# Patient Record
Sex: Male | Born: 1945
Health system: Southern US, Community
[De-identification: ages and names within clinical notes are randomized; demographics above are authoritative.]

## PROBLEM LIST (undated history)

## (undated) DIAGNOSIS — N400 Enlarged prostate without lower urinary tract symptoms: Secondary | ICD-10-CM

## (undated) DIAGNOSIS — I509 Heart failure, unspecified: Secondary | ICD-10-CM

## (undated) DIAGNOSIS — I493 Ventricular premature depolarization: Secondary | ICD-10-CM

## (undated) DIAGNOSIS — I2510 Atherosclerotic heart disease of native coronary artery without angina pectoris: Secondary | ICD-10-CM

## (undated) DIAGNOSIS — N281 Cyst of kidney, acquired: Secondary | ICD-10-CM

## (undated) DIAGNOSIS — F419 Anxiety disorder, unspecified: Secondary | ICD-10-CM

## (undated) DIAGNOSIS — E785 Hyperlipidemia, unspecified: Secondary | ICD-10-CM

## (undated) DIAGNOSIS — K5790 Diverticulosis of intestine, part unspecified, without perforation or abscess without bleeding: Secondary | ICD-10-CM

## (undated) HISTORY — DX: Anxiety disorder, unspecified: F41.9

## (undated) HISTORY — PX: HYDROCELE EXCISION / REPAIR: SUR1145

## (undated) HISTORY — PX: NEPHRECTOMY: SHX65

## (undated) HISTORY — DX: Diverticulosis of intestine, part unspecified, without perforation or abscess without bleeding: K57.90

## (undated) HISTORY — DX: Atherosclerotic heart disease of native coronary artery without angina pectoris: I25.10

## (undated) HISTORY — DX: Hyperlipidemia, unspecified: E78.5

## (undated) HISTORY — DX: Cyst of kidney, acquired: N28.1

## (undated) HISTORY — PX: TONSILLECTOMY: SUR1361

## (undated) HISTORY — DX: Ventricular premature depolarization: I49.3

## (undated) HISTORY — DX: Heart failure, unspecified: I50.9

## (undated) HISTORY — DX: Benign prostatic hyperplasia without lower urinary tract symptoms: N40.0

---

## 2003-06-26 ENCOUNTER — Ambulatory Visit (HOSPITAL_COMMUNITY): Admission: RE | Admit: 2003-06-26 | Discharge: 2003-06-26 | Payer: Self-pay | Admitting: Gastroenterology

## 2003-12-04 ENCOUNTER — Ambulatory Visit (HOSPITAL_COMMUNITY): Admission: RE | Admit: 2003-12-04 | Discharge: 2003-12-04 | Payer: Self-pay | Admitting: Cardiovascular Disease

## 2003-12-04 HISTORY — PX: CARDIAC CATHETERIZATION: SHX172

## 2004-05-06 ENCOUNTER — Ambulatory Visit: Payer: Self-pay | Admitting: Pulmonary Disease

## 2005-08-07 HISTORY — PX: US ECHOCARDIOGRAPHY: HXRAD669

## 2006-08-20 HISTORY — PX: CARDIOVASCULAR STRESS TEST: SHX262

## 2010-06-01 ENCOUNTER — Ambulatory Visit: Payer: Self-pay | Admitting: Cardiovascular Disease

## 2010-08-31 ENCOUNTER — Ambulatory Visit (HOSPITAL_COMMUNITY)
Admission: EM | Admit: 2010-08-31 | Discharge: 2010-09-01 | Disposition: A | Discharge status: Home / Self Care | Payer: BC Managed Care – PPO | Attending: Emergency Medicine | Admitting: Emergency Medicine

## 2010-08-31 DIAGNOSIS — K2990 Gastroduodenitis, unspecified, without bleeding: Secondary | ICD-10-CM | POA: Insufficient documentation

## 2010-08-31 DIAGNOSIS — K2970 Gastritis, unspecified, without bleeding: Secondary | ICD-10-CM | POA: Insufficient documentation

## 2010-08-31 DIAGNOSIS — R1310 Dysphagia, unspecified: Secondary | ICD-10-CM | POA: Insufficient documentation

## 2010-08-31 DIAGNOSIS — Z860100 Personal history of colon polyps, unspecified: Secondary | ICD-10-CM | POA: Insufficient documentation

## 2010-08-31 DIAGNOSIS — IMO0002 Reserved for concepts with insufficient information to code with codable children: Secondary | ICD-10-CM | POA: Insufficient documentation

## 2010-08-31 DIAGNOSIS — Z79899 Other long term (current) drug therapy: Secondary | ICD-10-CM | POA: Insufficient documentation

## 2010-08-31 DIAGNOSIS — K219 Gastro-esophageal reflux disease without esophagitis: Secondary | ICD-10-CM | POA: Insufficient documentation

## 2010-08-31 DIAGNOSIS — Z7902 Long term (current) use of antithrombotics/antiplatelets: Secondary | ICD-10-CM | POA: Insufficient documentation

## 2010-08-31 DIAGNOSIS — I2510 Atherosclerotic heart disease of native coronary artery without angina pectoris: Secondary | ICD-10-CM | POA: Insufficient documentation

## 2010-08-31 DIAGNOSIS — Z7982 Long term (current) use of aspirin: Secondary | ICD-10-CM | POA: Insufficient documentation

## 2010-08-31 DIAGNOSIS — I252 Old myocardial infarction: Secondary | ICD-10-CM | POA: Insufficient documentation

## 2010-08-31 DIAGNOSIS — I10 Essential (primary) hypertension: Secondary | ICD-10-CM | POA: Insufficient documentation

## 2010-08-31 DIAGNOSIS — T18108A Unspecified foreign body in esophagus causing other injury, initial encounter: Secondary | ICD-10-CM | POA: Insufficient documentation

## 2010-08-31 DIAGNOSIS — E7800 Pure hypercholesterolemia, unspecified: Secondary | ICD-10-CM | POA: Insufficient documentation

## 2010-10-05 NOTE — Op Note (Signed)
  NAMETRUE, GARCIAMARTINEZ              ACCOUNT NO.:  [id number]  MEDICAL RECORD NO.:  [id number]           PATIENT TYPE:  E  LOCATION:  WLED                         FACILITY:  [facility]  PHYSICIAN:  Graylin Shiver, M.D.   DATE OF BIRTH:  Jun 03, 1945  DATE OF PROCEDURE:  09/01/2010 DATE OF DISCHARGE:  09/01/2010                              OPERATIVE REPORT   PROCEDURE:  Upper gastrointestinal endoscopy with foreign body removal from esophagus.  INDICATIONS FOR PROCEDURE:  The patient is a 65 year old male who has been unable to swallow for several hours.  He keeps bringing up his saliva.  He had been eating steak for dinner.  I was called because of this problem.  Informed consent was obtained after explanation of the risks of bleeding, infection and perforation.  PREMEDICATIONS: 1. Fentanyl 50 mcg IV. 2. Versed 4 mg IV.  DESCRIPTION OF PROCEDURE:  With the patient in the left lateral decubitus position, the Pentax gastroscope was inserted into the oropharynx and passed into the esophagus.  In the proximal to mid esophageal region, a meat impaction was noted.  A 4-prong grasper was advanced down the scope and the meat impaction was grasped and it initially fragmented up somewhat.  I therefore tried to grasp this with a Lucina Mellow Net but was unable to.  I then put the 4-prong grasper back in and after a couple of times was able to grab the entire meat piece and remove it.  After this, the scope was then reinserted and I passed it down into the duodenum.  The second portion and bulb of the duodenum looked normal.  The stomach showed some gastritis in the antrum but the rest of the gastric mucosa looked okay.  No strictures were seen in the esophagus.  There was some irritation and friability in the upper to mid esophagus where the meat had been impacted.  He tolerated the procedure without complications.  IMPRESSION:  Meat impaction, removed.           ______________________________ Graylin Shiver, M.D.     SFG/MEDQ  D:  09/02/2010  T:  09/02/2010  Job:  [id number]  cc:   Gwen Pounds, MD Fax: [phone]  Everardo All. Madilyn Fireman, M.D. Fax: [phone]  Electronically Signed by Herbert Moors MD on 10/05/2010 10:03:42 AM

## 2010-11-29 ENCOUNTER — Encounter: Payer: Self-pay | Admitting: Cardiovascular Disease

## 2010-12-01 ENCOUNTER — Encounter: Payer: Self-pay | Admitting: Cardiovascular Disease

## 2010-12-01 ENCOUNTER — Ambulatory Visit (INDEPENDENT_AMBULATORY_CARE_PROVIDER_SITE_OTHER): Payer: BC Managed Care – PPO | Admitting: Cardiovascular Disease

## 2010-12-01 DIAGNOSIS — I2510 Atherosclerotic heart disease of native coronary artery without angina pectoris: Secondary | ICD-10-CM

## 2010-12-01 DIAGNOSIS — E785 Hyperlipidemia, unspecified: Secondary | ICD-10-CM

## 2010-12-01 DIAGNOSIS — I5020 Unspecified systolic (congestive) heart failure: Secondary | ICD-10-CM | POA: Insufficient documentation

## 2010-12-01 DIAGNOSIS — I509 Heart failure, unspecified: Secondary | ICD-10-CM | POA: Insufficient documentation

## 2010-12-01 NOTE — Assessment & Plan Note (Signed)
He's not having symptoms of congestive heart failure. He's on a very low dose of Diovan. His blood pressure is in the normal range I suspected we could increase his Diovan at 80 mg a day.  He sees Dr. Terrial Rhodes  his renal dysfunction. I'm not sure if he has tried a  higher dose of Diovan. His last creatinine I have is 1.5.  Since his renal function seems to be fairly stable, we will continue with his present dose of Diovan. I would be in favor of increasing his Diovan if Dr. Arrie Aran is in favor of it.

## 2010-12-01 NOTE — Progress Notes (Signed)
Maurice Barron Date of Birth  [birth year] [facility] [address]     [address] [place], [place]  [address] [phone]  Fax  [phone]  History of Present Illness:  Maurice Barron is a middle-aged gentleman with a history of coronary artery disease and congestive heart failure. Had a heart catheterization in 2005. During the heart catheterization he was found to have a ruptured plaque in his proximal left anterior descending artery. He had moderate irregularities and the other coronary arteries. He was started on Plavix at that time.  He's had a history of congestive heart failure. His initial ejection fraction was around 35%. With good medical therapy, his ejection fraction has now increased to 55%. He is no longer having any episodes of chest pain or shortness of breath.  He complains of having lots of bleeding and bruising on the Plavix. He would like to stop it. He's been exercising on an intermittent basis.  Current Outpatient Prescriptions on File Prior to Visit  Medication Sig Dispense Refill  . aspirin 81 MG tablet Take 81 mg by mouth daily.        . carvedilol (COREG) 25 MG tablet Take 25 mg by mouth 2 (two) times daily with a meal.        . clopidogrel (PLAVIX) 75 MG tablet Take 75 mg by mouth daily.        . fish oil-omega-3 fatty acids 1000 MG capsule Take 1 g by mouth daily.        . Multiple Vitamin (MULTIVITAMIN) tablet Take 1 tablet by mouth daily.        . rosuvastatin (CRESTOR) 40 MG tablet Take 40 mg by mouth daily.        . valsartan (DIOVAN) 40 MG tablet Take 40 mg by mouth daily.          Allergies  Allergen Reactions  . Penicillins     Past Medical History  Diagnosis Date  . CHF (congestive heart failure)     EF 35%. WITH MEDICATION HIS EF IS NOW 55%  . Hyperlipidemia   . PVC's (premature ventricular contractions)   . Coronary artery disease     ruptured plaque in the LAD - started Plavix at that point.  . Anxiety       Past Surgical History  Procedure Date  . Cardiac catheterization 12/04/2003    EF 30-35%  . Nephrectomy   . Tonsillectomy   . Hydrocele excision / repair   . US echocardiography 08/07/2005    EF 50-55%  . Cardiovascular stress test 08/20/2006    EF 50%    History  Smoking status  . Former Smoker  . Quit date: 05/08/1992  Smokeless tobacco  . Not on file    History  Alcohol Use No    Family History  Problem Relation Age of Onset  . Heart disease Mother   . Sarcoidosis Mother   . Heart attack Father   . Heart attack Brother   . Coronary artery disease Brother     Reviw of Systems:  Reviewed in the HPI.  All other systems are negative.  Physical Exam: BP 130/78  Pulse 72  Ht 5\' 8"  (1.727 m)  Wt 190 lb (86.183 kg)  BMI 28.89 kg/m2 The patient is alert and oriented x 3.  The mood and affect are normal.   Skin: warm and dry.  Color is normal.    HEENT:   the sclera are nonicteric.  The mucous membranes are moist.  The carotids are 2+ without bruits.  There is no thyromegaly.  There is no JVD.    Lungs: clear.  The chest wall is non tender.    Heart: regular rate with a normal S1 and S2.  There are no murmurs, gallops, or rubs. The PMI is not displaced.     Abdomen: good bowel sounds.  There is no guarding or rebound.  There is no hepatosplenomegaly or tenderness.  There are no masses.   Extremities:  no clubbing, cyanosis, or edema.  The legs are without rashes.  The distal pulses are intact.   Neuro:  Cranial nerves II - XII are intact.  Motor and sensory functions are intact.    The gait is normal.  Assessment / Plan:

## 2010-12-01 NOTE — Assessment & Plan Note (Signed)
He seems to be very stable from a coronary standpoint. He was put on Plavix at the time of his initial cardiac catheterization because of evidence of a ruptured plaque. He seems to be doing well. I encouraged him to continue with the medication since it seems to be helping him. I am pleased he's not had any episodes of angina.

## 2010-12-01 NOTE — Assessment & Plan Note (Signed)
We'll check the lipid profile at his next visit.

## 2013-07-01 ENCOUNTER — Telehealth: Payer: Self-pay | Admitting: Cardiovascular Disease

## 2013-07-01 NOTE — Telephone Encounter (Signed)
Received request from Nurse fax box, documents faxed for surgical clearance. °To: [facility]  °Fax number: [phone] °Attention °[id number]/kdm: °

## 2013-07-17 ENCOUNTER — Telehealth: Payer: Self-pay | Admitting: Cardiovascular Disease

## 2013-07-17 NOTE — Telephone Encounter (Signed)
New Prob    Pt is wanting to stop his Plavis. Please call.

## 2013-07-17 NOTE — Telephone Encounter (Signed)
Spoke with patient and he states he is doing ok. Patient wants to know if he needs to continue Plavix or if ok to stop. Will forward to [place] and Dr Acie Fredrickson for review

## 2013-07-18 NOTE — Telephone Encounter (Signed)
Would continue. Will address at next ov

## 2013-07-18 NOTE — Telephone Encounter (Signed)
App made/ pt will continue plavix.

## 2013-08-25 ENCOUNTER — Encounter: Payer: Self-pay | Admitting: Cardiovascular Disease

## 2013-09-02 ENCOUNTER — Encounter: Payer: Self-pay | Admitting: Cardiovascular Disease

## 2013-09-02 ENCOUNTER — Ambulatory Visit (INDEPENDENT_AMBULATORY_CARE_PROVIDER_SITE_OTHER): Payer: Commercial Managed Care - HMO | Admitting: Cardiovascular Disease

## 2013-09-02 VITALS — BP 142/79 | HR 59 | Ht 69.0 in | Wt 189.0 lb

## 2013-09-02 DIAGNOSIS — I509 Heart failure, unspecified: Secondary | ICD-10-CM

## 2013-09-02 DIAGNOSIS — E785 Hyperlipidemia, unspecified: Secondary | ICD-10-CM

## 2013-09-02 DIAGNOSIS — I2510 Atherosclerotic heart disease of native coronary artery without angina pectoris: Secondary | ICD-10-CM

## 2013-09-02 NOTE — Patient Instructions (Signed)
Your physician recommends that you continue on your current medications as directed. Please refer to the Current Medication list given to you today.  Your physician wants you to follow-up in: 1 year with Dr. Nahser.  You will receive a reminder letter in the mail two months in advance. If you don't receive a letter, please call our office to schedule the follow-up appointment.  

## 2013-09-02 NOTE — Assessment & Plan Note (Signed)
Maurice Barron is done very well. He's not having episodes of chest pain shortness breath. He does have some bleeding and bruising when he scratches himself this is probably because of the Plavix. He has a history of an ulcerated lesion in the LAD.  Discontinue the Plavix and aspirin and he's done quite well. He does have some bruising to the same medications.  His lipids are been monitored by his medical Dr.

## 2013-09-02 NOTE — Assessment & Plan Note (Signed)
Has history of CHF. He's on carvedilol and Avapro. We'll continue the same medications. I'll see him again in one year.

## 2013-09-02 NOTE — Progress Notes (Signed)
Maurice Barron Date of Birth  May 11, 1945 [facility] [address]     [place], [place]  [address] [phone]  Fax  [phone]   Problem list: 1. Coronary artery disease- mild irregularities including a plaque rupture in the LAD, treated medically 2. Chronic systolic congestive heart failure Repaired hyperlipidemia 4.  History of Present Illness:  Maurice Barron is a middle-aged gentleman with a history of coronary artery disease and congestive heart failure. Had a heart catheterization in 2005. During the heart catheterization he was found to have a ruptured plaque in his proximal left anterior descending artery. He had moderate irregularities and the other coronary arteries. He was started on Plavix at that time.  He's had a history of congestive heart failure. His initial ejection fraction was around 35%. With good medical therapy, his ejection fraction has now increased to 55%. He is no longer having any episodes of chest pain or shortness of breath.  He complains of having lots of bleeding and bruising on the Plavix. He would like to stop it. He's been exercising on an intermittent basis.  September 02, 2013:  Maurice Barron is doing well.   He was last seen 3 years ago.   He has been on Plavix for 10 years for ruptured plaque in its proximal LAD. [facility].     Current Outpatient Prescriptions on File Prior to Visit  Medication Sig Dispense Refill  . aspirin 81 MG tablet Take 81 mg by mouth daily.        . carvedilol (COREG) 25 MG tablet Take 25 mg by mouth 2 (two) times daily with a meal.        . clopidogrel (PLAVIX) 75 MG tablet Take 75 mg by mouth daily.        . fish oil-omega-3 fatty acids 1000 MG capsule Take 1 g by mouth daily.        . rosuvastatin (CRESTOR) 40 MG tablet Take 40 mg by mouth daily.         No current facility-administered medications on file prior to visit.    Allergies  Allergen  Reactions  . Penicillins     Past Medical History  Diagnosis Date  . CHF (congestive heart failure)     EF 35%. WITH MEDICATION HIS EF IS NOW 55%  . Hyperlipidemia   . PVC's (premature ventricular contractions)   . Coronary artery disease     ruptured plaque in the LAD - started Plavix at that point.  . Anxiety     Past Surgical History  Procedure Laterality Date  . Cardiac catheterization  12/04/2003    EF 30-35%  . Nephrectomy    . Tonsillectomy    . Hydrocele excision / repair    . US echocardiography  08/07/2005    EF 50-55%  . Cardiovascular stress test  08/20/2006    EF 50%    History  Smoking status  . Former Smoker  . Quit date: 05/08/1992  Smokeless tobacco  . Not on file    History  Alcohol Use No    Family History  Problem Relation Age of Onset  . Heart disease Mother   . Sarcoidosis Mother   . Heart attack Father   . Heart attack Brother   . Coronary artery disease Brother     Reviw of Systems:  Reviewed in the HPI.  All other systems are negative.  Physical  Exam: BP 142/79  Pulse 59  Ht 5\' 9"  (1.753 m)  Wt 189 lb (85.73 kg)  BMI 27.90 kg/m2 The patient is alert and oriented x 3.  The mood and affect are normal.   Skin: warm and dry.  Color is normal.    HEENT:   the sclera are nonicteric.  The mucous membranes are moist.  The carotids are 2+ without bruits.  There is no thyromegaly.  There is no JVD.    Lungs: clear.  The chest wall is non tender.    Heart: regular rate with a normal S1 and S2.  There are no murmurs, gallops, or rubs. The PMI is not displaced.     Abdomen: good bowel sounds.  There is no guarding or rebound.  There is no hepatosplenomegaly or tenderness.  There are no masses.   Extremities:  no clubbing, cyanosis, or edema.  The legs are without rashes.  The distal pulses are intact.   Neuro:  Cranial nerves II - XII are intact.  Motor and sensory functions are intact.    The gait is normal.  ECG: Sinus brady at 59.  No ST or T wave changes.  Assessment / Plan:

## 2014-06-17 DIAGNOSIS — I129 Hypertensive chronic kidney disease with stage 1 through stage 4 chronic kidney disease, or unspecified chronic kidney disease: Secondary | ICD-10-CM | POA: Diagnosis not present

## 2014-06-17 DIAGNOSIS — R809 Proteinuria, unspecified: Secondary | ICD-10-CM | POA: Diagnosis not present

## 2014-06-17 DIAGNOSIS — N182 Chronic kidney disease, stage 2 (mild): Secondary | ICD-10-CM | POA: Diagnosis not present

## 2014-06-17 DIAGNOSIS — N2581 Secondary hyperparathyroidism of renal origin: Secondary | ICD-10-CM | POA: Diagnosis not present

## 2014-09-02 ENCOUNTER — Ambulatory Visit (INDEPENDENT_AMBULATORY_CARE_PROVIDER_SITE_OTHER): Payer: Commercial Managed Care - HMO | Admitting: Cardiovascular Disease

## 2014-09-02 ENCOUNTER — Encounter: Payer: Self-pay | Admitting: Cardiovascular Disease

## 2014-09-02 VITALS — BP 118/60 | HR 67 | Ht 69.0 in | Wt 195.1 lb

## 2014-09-02 DIAGNOSIS — I5022 Chronic systolic (congestive) heart failure: Secondary | ICD-10-CM

## 2014-09-02 NOTE — Progress Notes (Signed)
Cardiology Office Note   Date:  09/02/2014   ID:  Maurice Barron, DOB 1945/07/28, MRN [id number]  PCP:  Tivis Ringer, MD  Cardiologist:   Thayer Headings, MD   Chief Complaint  Patient presents with  . Follow-up   1. Coronary artery disease- mild irregularities including a plaque rupture in the LAD, treated medically 2. Chronic systolic congestive heart failure 3.  Hyperlipidemia   History of Present Illness:  Maurice Barron is a middle-aged gentleman with a history of coronary artery disease and congestive heart failure. Had a heart catheterization in 2005. During the heart catheterization he was found to have a ruptured plaque in his proximal left anterior descending artery. He had moderate irregularities and the other coronary arteries. He was started on Plavix at that time.  He's had a history of congestive heart failure. His initial ejection fraction was around 35%. With good medical therapy, his ejection fraction has now increased to 55%. He is no longer having any episodes of chest pain or shortness of breath.  He complains of having lots of bleeding and bruising on the Plavix. He would like to stop it. He's been exercising on an intermittent basis.  September 02, 2013:  Maurice Barron is doing well. He was last seen 3 years ago. He has been on Plavix for 10 years for ruptured plaque in its proximal LAD. [facility].    September 02, 2014:  Maurice Barron is a 69 y.o. male who presents for  Follow up of his CHF.  Still working out regularly.  Goes to the [facility]    Past Medical History  Diagnosis Date  . CHF (congestive heart failure)     EF 35%. WITH MEDICATION HIS EF IS NOW 55%  . Hyperlipidemia   . PVC's (premature ventricular contractions)   . Coronary artery disease     ruptured plaque in the LAD - started Plavix at that point.  . Anxiety     Past Surgical History  Procedure Laterality Date  . Cardiac catheterization  12/04/2003   EF 30-35%  . Nephrectomy    . Tonsillectomy    . Hydrocele excision / repair    . US echocardiography  08/07/2005    EF 50-55%  . Cardiovascular stress test  08/20/2006    EF 50%     Current Outpatient Prescriptions  Medication Sig Dispense Refill  . aspirin 81 MG tablet Take 81 mg by mouth daily.      . carvedilol (COREG) 25 MG tablet Take 25 mg by mouth 2 (two) times daily with a meal.      . clopidogrel (PLAVIX) 75 MG tablet Take 75 mg by mouth daily.      . fish oil-omega-3 fatty acids 1000 MG capsule Take 1 g by mouth daily.      . rosuvastatin (CRESTOR) 40 MG tablet Take 40 mg by mouth daily.       No current facility-administered medications for this visit.    Allergies:   Penicillins    Social History:  The patient  reports that he quit smoking about 22 years ago. He does not have any smokeless tobacco history on file. He reports that he does not drink alcohol or use illicit drugs.   Family History:  The patient's family history includes Coronary artery disease in his brother; Heart attack in his brother and father; Heart disease in his mother; Sarcoidosis in his mother.  ROS:  Please see the history of present illness.    Review of Systems: Constitutional:  denies fever, chills, diaphoresis, appetite change and fatigue.  HEENT: denies photophobia, eye pain, redness, hearing loss, ear pain, congestion, sore throat, rhinorrhea, sneezing, neck pain, neck stiffness and tinnitus.  Respiratory: denies SOB, DOE, cough, chest tightness, and wheezing.  Cardiovascular: denies chest pain, palpitations and leg swelling.  Gastrointestinal: denies nausea, vomiting, abdominal pain, diarrhea, constipation, blood in stool.  Genitourinary: denies dysuria, urgency, frequency, hematuria, flank pain and difficulty urinating.  Musculoskeletal: denies  myalgias, back pain, joint swelling, arthralgias and gait problem.   Skin: denies pallor, rash and wound.  Neurological: denies dizziness,  seizures, syncope, weakness, light-headedness, numbness and headaches.   Hematological: denies adenopathy, easy bruising, personal or family bleeding history.  Psychiatric/ Behavioral: denies suicidal ideation, mood changes, confusion, nervousness, sleep disturbance and agitation.       All other systems are reviewed and negative.    PHYSICAL EXAM: VS:  BP 118/60 mmHg  Pulse 67  Ht 5\' 9"  (1.753 m)  Wt 195 lb 1.9 oz (88.506 kg)  BMI 28.80 kg/m2 , BMI Body mass index is 28.8 kg/(m^2). GEN: Well nourished, well developed, in no acute distress HEENT: normal Neck: no JVD, carotid bruits, or masses Cardiac: RRR; no murmurs, rubs, or gallops,no edema  Respiratory:  clear to auscultation bilaterally, normal work of breathing GI: soft, nontender, nondistended, + BS MS: no deformity or atrophy Skin: warm and dry, no rash Neuro:  Strength and sensation are intact Psych: normal   EKG:  EKG is ordered today. The ekg ordered today demonstrates NSR at 67.    Recent Labs: No results found for requested labs within last 365 days.    Lipid Panel No results found for: CHOL, TRIG, HDL, CHOLHDL, VLDL, LDLCALC, LDLDIRECT    Wt Readings from Last 3 Encounters:  09/02/14 195 lb 1.9 oz (88.506 kg)  09/02/13 189 lb (85.73 kg)  12/01/10 190 lb (86.183 kg)      Other studies Reviewed: Additional studies/ records that were reviewed today include: . Review of the above records demonstrates:    ASSESSMENT AND PLAN:  1. Coronary artery disease- mild irregularities including a plaque rupture in the LAD, treated medically  2. Chronic systolic congestive heart failure- continue Coreg 25 BId, Avapro 75 a day , will consider Echo next year  3.   hyperlipidemia   Current medicines are reviewed at length with the patient today.  The patient does not have concerns regarding medicines.  The following changes have been made:  no change  Labs/ tests ordered today include: No orders of the  defined types were placed in this encounter.     Disposition:   FU with me in 1 year     Signed, Nahser, Wonda Cheng, MD  09/02/2014 2:16 PM    [facility] [address], [place], [place]  [address] Phone: [phone]; Fax: [phone]

## 2014-09-02 NOTE — Patient Instructions (Signed)
Medication Instructions:  Your physician recommends that you continue on your current medications as directed. Please refer to the Current Medication list given to you today.   Labwork: None  Testing/Procedures: None  Follow-Up: Your physician wants you to follow-up in: 1 year with Dr. Nahser.  You will receive a reminder letter in the mail two months in advance. If you don't receive a letter, please call our office to schedule the follow-up appointment.      

## 2014-10-15 DIAGNOSIS — I2510 Atherosclerotic heart disease of native coronary artery without angina pectoris: Secondary | ICD-10-CM | POA: Diagnosis not present

## 2014-10-15 DIAGNOSIS — E785 Hyperlipidemia, unspecified: Secondary | ICD-10-CM | POA: Diagnosis not present

## 2014-10-15 DIAGNOSIS — G4733 Obstructive sleep apnea (adult) (pediatric): Secondary | ICD-10-CM | POA: Diagnosis not present

## 2014-10-15 DIAGNOSIS — I10 Essential (primary) hypertension: Secondary | ICD-10-CM | POA: Diagnosis not present

## 2014-10-15 DIAGNOSIS — N183 Chronic kidney disease, stage 3 (moderate): Secondary | ICD-10-CM | POA: Diagnosis not present

## 2014-10-27 ENCOUNTER — Encounter: Payer: Self-pay | Admitting: Cardiovascular Disease

## 2015-01-23 DIAGNOSIS — Z23 Encounter for immunization: Secondary | ICD-10-CM | POA: Diagnosis not present

## 2015-03-30 DIAGNOSIS — H52223 Regular astigmatism, bilateral: Secondary | ICD-10-CM | POA: Diagnosis not present

## 2015-03-30 DIAGNOSIS — H5210 Myopia, unspecified eye: Secondary | ICD-10-CM | POA: Diagnosis not present

## 2015-05-21 DIAGNOSIS — Z125 Encounter for screening for malignant neoplasm of prostate: Secondary | ICD-10-CM | POA: Diagnosis not present

## 2015-05-21 DIAGNOSIS — E784 Other hyperlipidemia: Secondary | ICD-10-CM | POA: Diagnosis not present

## 2015-05-21 DIAGNOSIS — I2510 Atherosclerotic heart disease of native coronary artery without angina pectoris: Secondary | ICD-10-CM | POA: Diagnosis not present

## 2015-05-21 DIAGNOSIS — N182 Chronic kidney disease, stage 2 (mild): Secondary | ICD-10-CM | POA: Diagnosis not present

## 2015-05-27 DIAGNOSIS — Z1212 Encounter for screening for malignant neoplasm of rectum: Secondary | ICD-10-CM | POA: Diagnosis not present

## 2015-05-28 DIAGNOSIS — N183 Chronic kidney disease, stage 3 (moderate): Secondary | ICD-10-CM | POA: Diagnosis not present

## 2015-05-28 DIAGNOSIS — Z0000 Encounter for general adult medical examination without abnormal findings: Secondary | ICD-10-CM | POA: Diagnosis not present

## 2015-05-28 DIAGNOSIS — M1990 Unspecified osteoarthritis, unspecified site: Secondary | ICD-10-CM | POA: Diagnosis not present

## 2015-05-28 DIAGNOSIS — E784 Other hyperlipidemia: Secondary | ICD-10-CM | POA: Diagnosis not present

## 2015-05-28 DIAGNOSIS — I509 Heart failure, unspecified: Secondary | ICD-10-CM | POA: Diagnosis not present

## 2015-05-28 DIAGNOSIS — I2510 Atherosclerotic heart disease of native coronary artery without angina pectoris: Secondary | ICD-10-CM | POA: Diagnosis not present

## 2015-05-28 DIAGNOSIS — D692 Other nonthrombocytopenic purpura: Secondary | ICD-10-CM | POA: Diagnosis not present

## 2015-05-28 DIAGNOSIS — I10 Essential (primary) hypertension: Secondary | ICD-10-CM | POA: Diagnosis not present

## 2015-05-28 DIAGNOSIS — R4702 Dysphasia: Secondary | ICD-10-CM | POA: Diagnosis not present

## 2015-09-07 ENCOUNTER — Ambulatory Visit (INDEPENDENT_AMBULATORY_CARE_PROVIDER_SITE_OTHER): Payer: Commercial Managed Care - HMO | Admitting: Cardiovascular Disease

## 2015-09-07 ENCOUNTER — Encounter: Payer: Self-pay | Admitting: Cardiovascular Disease

## 2015-09-07 VITALS — BP 130/70 | HR 58 | Ht 69.0 in | Wt 195.4 lb

## 2015-09-07 DIAGNOSIS — E785 Hyperlipidemia, unspecified: Secondary | ICD-10-CM

## 2015-09-07 DIAGNOSIS — I25119 Atherosclerotic heart disease of native coronary artery with unspecified angina pectoris: Secondary | ICD-10-CM | POA: Diagnosis not present

## 2015-09-07 DIAGNOSIS — I5022 Chronic systolic (congestive) heart failure: Secondary | ICD-10-CM

## 2015-09-07 NOTE — Progress Notes (Signed)
Cardiology Office Note   Date:  09/07/2015   ID:  Maurice Barron, DOB 1945-07-05, MRN [id number]  PCP:  Tivis Ringer, MD  Cardiologist:   Thayer Headings, MD   Chief Complaint  Patient presents with  . Follow-up  . Coronary Artery Disease   1. Coronary artery disease- mild irregularities including a plaque rupture in the LAD, treated medically 2. Chronic systolic congestive heart failure 3.  Hyperlipidemia   History of Present Illness:  Maurice Barron is a middle-aged gentleman with a history of coronary artery disease and congestive heart failure. Had a heart catheterization in 2005. During the heart catheterization he was found to have a ruptured plaque in his proximal left anterior descending artery. He had moderate irregularities and the other coronary arteries. He was started on Plavix at that time.  He's had a history of congestive heart failure. His initial ejection fraction was around 35%. With good medical therapy, his ejection fraction has now increased to 55%. He is no longer having any episodes of chest pain or shortness of breath.  He complains of having lots of bleeding and bruising on the Plavix. He would like to stop it. He's been exercising on an intermittent basis.  September 02, 2013:  Maurice Barron is doing well. He was last seen 3 years ago. He has been on Plavix for 10 years for ruptured plaque in its proximal LAD. [facility].    September 02, 2014:  Maurice Barron is a 70 y.o. male who presents for  Follow up of his CHF.  Still working out regularly.  Goes to the [facility].  Sep 07, 2015:  Maurice Barron is seen today for follow up of his chronic systolic CHF , mild CAD and hyperlipidemia Still goes to the [facility] 3 days a week.   His EF has now been normal for the past several years. ( although he has not had an echo in years.   Past Medical History  Diagnosis Date  . CHF (congestive heart failure) (HCC)     EF 35%. WITH MEDICATION HIS  EF IS NOW 55%  . Hyperlipidemia   . PVC's (premature ventricular contractions)   . Coronary artery disease     ruptured plaque in the LAD - started Plavix at that point.  . Anxiety     Past Surgical History  Procedure Laterality Date  . Cardiac catheterization  12/04/2003    EF 30-35%  . Nephrectomy    . Tonsillectomy    . Hydrocele excision / repair    . US echocardiography  08/07/2005    EF 50-55%  . Cardiovascular stress test  08/20/2006    EF 50%     Current Outpatient Prescriptions  Medication Sig Dispense Refill  . aspirin 81 MG tablet Take 81 mg by mouth daily.      . carvedilol (COREG) 25 MG tablet Take 25 mg by mouth 2 (two) times daily with a meal.      . clopidogrel (PLAVIX) 75 MG tablet Take 75 mg by mouth daily.      . fish oil-omega-3 fatty acids 1000 MG capsule Take 1 g by mouth daily.      . irbesartan (AVAPRO) 75 MG tablet Take 75 mg by mouth daily.    . rosuvastatin (CRESTOR) 40 MG tablet Take 40 mg by mouth daily.       No current facility-administered medications for this visit.    Allergies:  Penicillins    Social History:  The patient  reports that he quit smoking about 23 years ago. He does not have any smokeless tobacco history on file. He reports that he does not drink alcohol or use illicit drugs.   Family History:  The patient's family history includes Coronary artery disease in his brother; Heart attack in his brother and father; Heart disease in his mother; Sarcoidosis in his mother.    ROS:  Please see the history of present illness.    Review of Systems: Constitutional:  denies fever, chills, diaphoresis, appetite change and fatigue.  HEENT: denies photophobia, eye pain, redness, hearing loss, ear pain, congestion, sore throat, rhinorrhea, sneezing, neck pain, neck stiffness and tinnitus.  Respiratory: denies SOB, DOE, cough, chest tightness, and wheezing.  Cardiovascular: denies chest pain, palpitations and leg swelling.    Gastrointestinal: denies nausea, vomiting, abdominal pain, diarrhea, constipation, blood in stool.  Genitourinary: denies dysuria, urgency, frequency, hematuria, flank pain and difficulty urinating.  Musculoskeletal: denies  myalgias, back pain, joint swelling, arthralgias and gait problem.   Skin: denies pallor, rash and wound.  Neurological: denies dizziness, seizures, syncope, weakness, light-headedness, numbness and headaches.   Hematological: denies adenopathy, easy bruising, personal or family bleeding history.  Psychiatric/ Behavioral: denies suicidal ideation, mood changes, confusion, nervousness, sleep disturbance and agitation.       All other systems are reviewed and negative.    PHYSICAL EXAM: VS:  BP 130/70 mmHg  Pulse 58  Ht 5\' 9"  (1.753 m)  Wt 195 lb 6.4 oz (88.633 kg)  BMI 28.84 kg/m2  SpO2 95% , BMI Body mass index is 28.84 kg/(m^2). GEN: Well nourished, well developed, in no acute distress HEENT: normal Neck: no JVD, carotid bruits, or masses Cardiac: RRR; no murmurs, rubs, or gallops,no edema  Respiratory:  clear to auscultation bilaterally, normal work of breathing GI: soft, nontender, nondistended, + BS MS: no deformity or atrophy Skin: warm and dry, no rash Neuro:  Strength and sensation are intact Psych: normal   EKG:  EKG is ordered today. The ekg ordered today demonstrates sinus brady at 58.   Otherwise normal ECG .    Recent Labs: No results found for requested labs within last 365 days.    Lipid Panel No results found for: CHOL, TRIG, HDL, CHOLHDL, VLDL, LDLCALC, LDLDIRECT    Wt Readings from Last 3 Encounters:  09/07/15 195 lb 6.4 oz (88.633 kg)  09/02/14 195 lb 1.9 oz (88.506 kg)  09/02/13 189 lb (85.73 kg)      Other studies Reviewed: Additional studies/ records that were reviewed today include: . Review of the above records demonstrates:    ASSESSMENT AND PLAN:  1. Coronary artery disease- mild irregularities including a  plaque rupture in the LAD, treated medically  2. Chronic systolic congestive heart failure- continue Coreg 25 BId, Avapro 75 a day ,   3.   Hyperlipidemia - managed by Dr. Dagmar Hait.    Current medicines are reviewed at length with the patient today.  The patient does not have concerns regarding medicines.  The following changes have been made:  no change  Labs/ tests ordered today include: No orders of the defined types were placed in this encounter.     Disposition:   FU with me in 1 year     Signed, Kysean Sweet, Wonda Cheng, MD  09/07/2015 2:09 PM    [address], [place], [place]  [address] Phone: [phone]; Fax: [phone]

## 2015-09-07 NOTE — Patient Instructions (Signed)
Medication Instructions:  Your physician recommends that you continue on your current medications as directed. Please refer to the Current Medication list given to you today.   Labwork: None Ordered   Testing/Procedures: None Ordered   Follow-Up: Your physician wants you to follow-up in: 1 year with Dr. Nahser.  You will receive a reminder letter in the mail two months in advance. If you don't receive a letter, please call our office to schedule the follow-up appointment.   If you need a refill on your cardiac medications before your next appointment, please call your pharmacy.   Thank you for choosing [facility]! Lashena Signer, RN [phone]    

## 2015-09-28 DIAGNOSIS — N182 Chronic kidney disease, stage 2 (mild): Secondary | ICD-10-CM | POA: Diagnosis not present

## 2015-09-28 DIAGNOSIS — N2581 Secondary hyperparathyroidism of renal origin: Secondary | ICD-10-CM | POA: Diagnosis not present

## 2015-09-28 DIAGNOSIS — R809 Proteinuria, unspecified: Secondary | ICD-10-CM | POA: Diagnosis not present

## 2015-09-28 DIAGNOSIS — I129 Hypertensive chronic kidney disease with stage 1 through stage 4 chronic kidney disease, or unspecified chronic kidney disease: Secondary | ICD-10-CM | POA: Diagnosis not present

## 2015-09-28 DIAGNOSIS — E785 Hyperlipidemia, unspecified: Secondary | ICD-10-CM | POA: Diagnosis not present

## 2015-10-14 ENCOUNTER — Encounter: Payer: Self-pay | Admitting: Cardiovascular Disease

## 2015-10-14 DIAGNOSIS — N183 Chronic kidney disease, stage 3 (moderate): Secondary | ICD-10-CM | POA: Diagnosis not present

## 2015-12-07 DIAGNOSIS — I2510 Atherosclerotic heart disease of native coronary artery without angina pectoris: Secondary | ICD-10-CM | POA: Diagnosis not present

## 2015-12-07 DIAGNOSIS — R4702 Dysphasia: Secondary | ICD-10-CM | POA: Diagnosis not present

## 2015-12-07 DIAGNOSIS — I10 Essential (primary) hypertension: Secondary | ICD-10-CM | POA: Diagnosis not present

## 2015-12-07 DIAGNOSIS — M79642 Pain in left hand: Secondary | ICD-10-CM | POA: Diagnosis not present

## 2015-12-07 DIAGNOSIS — G4733 Obstructive sleep apnea (adult) (pediatric): Secondary | ICD-10-CM | POA: Diagnosis not present

## 2015-12-07 DIAGNOSIS — I509 Heart failure, unspecified: Secondary | ICD-10-CM | POA: Diagnosis not present

## 2015-12-07 DIAGNOSIS — N183 Chronic kidney disease, stage 3 (moderate): Secondary | ICD-10-CM | POA: Diagnosis not present

## 2016-01-18 DIAGNOSIS — Z23 Encounter for immunization: Secondary | ICD-10-CM | POA: Diagnosis not present

## 2016-01-21 DIAGNOSIS — G4733 Obstructive sleep apnea (adult) (pediatric): Secondary | ICD-10-CM | POA: Diagnosis not present

## 2016-02-10 DIAGNOSIS — G4733 Obstructive sleep apnea (adult) (pediatric): Secondary | ICD-10-CM | POA: Diagnosis not present

## 2016-02-20 DIAGNOSIS — G4733 Obstructive sleep apnea (adult) (pediatric): Secondary | ICD-10-CM | POA: Diagnosis not present

## 2016-03-22 DIAGNOSIS — G4733 Obstructive sleep apnea (adult) (pediatric): Secondary | ICD-10-CM | POA: Diagnosis not present

## 2016-04-18 DIAGNOSIS — I10 Essential (primary) hypertension: Secondary | ICD-10-CM | POA: Diagnosis not present

## 2016-04-18 DIAGNOSIS — Z0100 Encounter for examination of eyes and vision without abnormal findings: Secondary | ICD-10-CM | POA: Diagnosis not present

## 2016-04-18 DIAGNOSIS — H524 Presbyopia: Secondary | ICD-10-CM | POA: Diagnosis not present

## 2016-04-21 DIAGNOSIS — G4733 Obstructive sleep apnea (adult) (pediatric): Secondary | ICD-10-CM | POA: Diagnosis not present

## 2016-05-22 DIAGNOSIS — G4733 Obstructive sleep apnea (adult) (pediatric): Secondary | ICD-10-CM | POA: Diagnosis not present

## 2016-05-30 DIAGNOSIS — I10 Essential (primary) hypertension: Secondary | ICD-10-CM | POA: Diagnosis not present

## 2016-05-30 DIAGNOSIS — Z125 Encounter for screening for malignant neoplasm of prostate: Secondary | ICD-10-CM | POA: Diagnosis not present

## 2016-05-30 DIAGNOSIS — E784 Other hyperlipidemia: Secondary | ICD-10-CM | POA: Diagnosis not present

## 2016-05-30 DIAGNOSIS — R8299 Other abnormal findings in urine: Secondary | ICD-10-CM | POA: Diagnosis not present

## 2016-06-06 DIAGNOSIS — I509 Heart failure, unspecified: Secondary | ICD-10-CM | POA: Diagnosis not present

## 2016-06-06 DIAGNOSIS — D692 Other nonthrombocytopenic purpura: Secondary | ICD-10-CM | POA: Diagnosis not present

## 2016-06-06 DIAGNOSIS — G4733 Obstructive sleep apnea (adult) (pediatric): Secondary | ICD-10-CM | POA: Diagnosis not present

## 2016-06-06 DIAGNOSIS — L309 Dermatitis, unspecified: Secondary | ICD-10-CM | POA: Diagnosis not present

## 2016-06-06 DIAGNOSIS — R4702 Dysphasia: Secondary | ICD-10-CM | POA: Diagnosis not present

## 2016-06-06 DIAGNOSIS — I2510 Atherosclerotic heart disease of native coronary artery without angina pectoris: Secondary | ICD-10-CM | POA: Diagnosis not present

## 2016-06-06 DIAGNOSIS — E784 Other hyperlipidemia: Secondary | ICD-10-CM | POA: Diagnosis not present

## 2016-06-06 DIAGNOSIS — Z0000 Encounter for general adult medical examination without abnormal findings: Secondary | ICD-10-CM | POA: Diagnosis not present

## 2016-06-06 DIAGNOSIS — Z6829 Body mass index (BMI) 29.0-29.9, adult: Secondary | ICD-10-CM | POA: Diagnosis not present

## 2016-06-07 DIAGNOSIS — G4733 Obstructive sleep apnea (adult) (pediatric): Secondary | ICD-10-CM | POA: Diagnosis not present

## 2016-06-21 DIAGNOSIS — Z1212 Encounter for screening for malignant neoplasm of rectum: Secondary | ICD-10-CM | POA: Diagnosis not present

## 2016-06-22 DIAGNOSIS — G4733 Obstructive sleep apnea (adult) (pediatric): Secondary | ICD-10-CM | POA: Diagnosis not present

## 2016-07-20 DIAGNOSIS — G4733 Obstructive sleep apnea (adult) (pediatric): Secondary | ICD-10-CM | POA: Diagnosis not present

## 2016-08-08 DIAGNOSIS — I10 Essential (primary) hypertension: Secondary | ICD-10-CM | POA: Diagnosis not present

## 2016-08-20 DIAGNOSIS — G4733 Obstructive sleep apnea (adult) (pediatric): Secondary | ICD-10-CM | POA: Diagnosis not present

## 2016-09-19 DIAGNOSIS — G4733 Obstructive sleep apnea (adult) (pediatric): Secondary | ICD-10-CM | POA: Diagnosis not present

## 2016-09-27 DIAGNOSIS — G4733 Obstructive sleep apnea (adult) (pediatric): Secondary | ICD-10-CM | POA: Diagnosis not present

## 2016-10-20 DIAGNOSIS — G4733 Obstructive sleep apnea (adult) (pediatric): Secondary | ICD-10-CM | POA: Diagnosis not present

## 2016-11-19 DIAGNOSIS — G4733 Obstructive sleep apnea (adult) (pediatric): Secondary | ICD-10-CM | POA: Diagnosis not present

## 2016-12-07 DIAGNOSIS — M1990 Unspecified osteoarthritis, unspecified site: Secondary | ICD-10-CM | POA: Diagnosis not present

## 2016-12-07 DIAGNOSIS — N183 Chronic kidney disease, stage 3 (moderate): Secondary | ICD-10-CM | POA: Diagnosis not present

## 2016-12-07 DIAGNOSIS — I10 Essential (primary) hypertension: Secondary | ICD-10-CM | POA: Diagnosis not present

## 2016-12-07 DIAGNOSIS — R4702 Dysphasia: Secondary | ICD-10-CM | POA: Diagnosis not present

## 2016-12-07 DIAGNOSIS — I2510 Atherosclerotic heart disease of native coronary artery without angina pectoris: Secondary | ICD-10-CM | POA: Diagnosis not present

## 2016-12-07 DIAGNOSIS — Z6829 Body mass index (BMI) 29.0-29.9, adult: Secondary | ICD-10-CM | POA: Diagnosis not present

## 2016-12-07 DIAGNOSIS — G4733 Obstructive sleep apnea (adult) (pediatric): Secondary | ICD-10-CM | POA: Diagnosis not present

## 2016-12-19 DIAGNOSIS — N183 Chronic kidney disease, stage 3 (moderate): Secondary | ICD-10-CM | POA: Diagnosis not present

## 2017-01-09 DIAGNOSIS — R809 Proteinuria, unspecified: Secondary | ICD-10-CM | POA: Diagnosis not present

## 2017-01-09 DIAGNOSIS — I129 Hypertensive chronic kidney disease with stage 1 through stage 4 chronic kidney disease, or unspecified chronic kidney disease: Secondary | ICD-10-CM | POA: Diagnosis not present

## 2017-01-09 DIAGNOSIS — N2581 Secondary hyperparathyroidism of renal origin: Secondary | ICD-10-CM | POA: Diagnosis not present

## 2017-01-09 DIAGNOSIS — E785 Hyperlipidemia, unspecified: Secondary | ICD-10-CM | POA: Diagnosis not present

## 2017-01-09 DIAGNOSIS — N182 Chronic kidney disease, stage 2 (mild): Secondary | ICD-10-CM | POA: Diagnosis not present

## 2017-01-20 DIAGNOSIS — Z23 Encounter for immunization: Secondary | ICD-10-CM | POA: Diagnosis not present

## 2017-06-12 DIAGNOSIS — I10 Essential (primary) hypertension: Secondary | ICD-10-CM | POA: Diagnosis not present

## 2017-06-12 DIAGNOSIS — R82998 Other abnormal findings in urine: Secondary | ICD-10-CM | POA: Diagnosis not present

## 2017-06-12 DIAGNOSIS — Z125 Encounter for screening for malignant neoplasm of prostate: Secondary | ICD-10-CM | POA: Diagnosis not present

## 2017-06-12 DIAGNOSIS — E7849 Other hyperlipidemia: Secondary | ICD-10-CM | POA: Diagnosis not present

## 2017-06-19 DIAGNOSIS — I10 Essential (primary) hypertension: Secondary | ICD-10-CM | POA: Diagnosis not present

## 2017-06-19 DIAGNOSIS — E7849 Other hyperlipidemia: Secondary | ICD-10-CM | POA: Diagnosis not present

## 2017-06-19 DIAGNOSIS — I509 Heart failure, unspecified: Secondary | ICD-10-CM | POA: Diagnosis not present

## 2017-06-19 DIAGNOSIS — I2510 Atherosclerotic heart disease of native coronary artery without angina pectoris: Secondary | ICD-10-CM | POA: Diagnosis not present

## 2017-06-19 DIAGNOSIS — N183 Chronic kidney disease, stage 3 (moderate): Secondary | ICD-10-CM | POA: Diagnosis not present

## 2017-06-19 DIAGNOSIS — G4733 Obstructive sleep apnea (adult) (pediatric): Secondary | ICD-10-CM | POA: Diagnosis not present

## 2017-06-19 DIAGNOSIS — N2581 Secondary hyperparathyroidism of renal origin: Secondary | ICD-10-CM | POA: Diagnosis not present

## 2017-06-19 DIAGNOSIS — Z0000 Encounter for general adult medical examination without abnormal findings: Secondary | ICD-10-CM | POA: Diagnosis not present

## 2017-06-19 DIAGNOSIS — R4702 Dysphasia: Secondary | ICD-10-CM | POA: Diagnosis not present

## 2017-06-21 DIAGNOSIS — Z1212 Encounter for screening for malignant neoplasm of rectum: Secondary | ICD-10-CM | POA: Diagnosis not present

## 2017-12-18 ENCOUNTER — Telehealth: Payer: Self-pay

## 2017-12-18 DIAGNOSIS — R4702 Dysphasia: Secondary | ICD-10-CM | POA: Diagnosis not present

## 2017-12-18 DIAGNOSIS — N183 Chronic kidney disease, stage 3 (moderate): Secondary | ICD-10-CM | POA: Diagnosis not present

## 2017-12-18 DIAGNOSIS — Z6829 Body mass index (BMI) 29.0-29.9, adult: Secondary | ICD-10-CM | POA: Diagnosis not present

## 2017-12-18 DIAGNOSIS — I10 Essential (primary) hypertension: Secondary | ICD-10-CM | POA: Diagnosis not present

## 2017-12-18 DIAGNOSIS — E7849 Other hyperlipidemia: Secondary | ICD-10-CM | POA: Diagnosis not present

## 2017-12-18 DIAGNOSIS — I2510 Atherosclerotic heart disease of native coronary artery without angina pectoris: Secondary | ICD-10-CM | POA: Diagnosis not present

## 2017-12-18 NOTE — Telephone Encounter (Signed)
Referral sent to scheduling. Notes on file.

## 2017-12-18 NOTE — Telephone Encounter (Signed)
Notes on file.  

## 2018-01-19 DIAGNOSIS — Z23 Encounter for immunization: Secondary | ICD-10-CM | POA: Diagnosis not present

## 2018-02-14 DIAGNOSIS — N2581 Secondary hyperparathyroidism of renal origin: Secondary | ICD-10-CM | POA: Diagnosis not present

## 2018-02-14 DIAGNOSIS — R809 Proteinuria, unspecified: Secondary | ICD-10-CM | POA: Diagnosis not present

## 2018-02-14 DIAGNOSIS — I129 Hypertensive chronic kidney disease with stage 1 through stage 4 chronic kidney disease, or unspecified chronic kidney disease: Secondary | ICD-10-CM | POA: Diagnosis not present

## 2018-02-14 DIAGNOSIS — E785 Hyperlipidemia, unspecified: Secondary | ICD-10-CM | POA: Diagnosis not present

## 2018-02-14 DIAGNOSIS — N182 Chronic kidney disease, stage 2 (mild): Secondary | ICD-10-CM | POA: Diagnosis not present

## 2018-05-23 DIAGNOSIS — G4733 Obstructive sleep apnea (adult) (pediatric): Secondary | ICD-10-CM | POA: Diagnosis not present

## 2018-06-15 DIAGNOSIS — J069 Acute upper respiratory infection, unspecified: Secondary | ICD-10-CM | POA: Diagnosis not present

## 2018-07-09 DIAGNOSIS — R82998 Other abnormal findings in urine: Secondary | ICD-10-CM | POA: Diagnosis not present

## 2018-07-09 DIAGNOSIS — Z125 Encounter for screening for malignant neoplasm of prostate: Secondary | ICD-10-CM | POA: Diagnosis not present

## 2018-07-09 DIAGNOSIS — E7849 Other hyperlipidemia: Secondary | ICD-10-CM | POA: Diagnosis not present

## 2018-07-09 DIAGNOSIS — I10 Essential (primary) hypertension: Secondary | ICD-10-CM | POA: Diagnosis not present

## 2018-07-16 DIAGNOSIS — Z1212 Encounter for screening for malignant neoplasm of rectum: Secondary | ICD-10-CM | POA: Diagnosis not present

## 2018-07-16 DIAGNOSIS — G4733 Obstructive sleep apnea (adult) (pediatric): Secondary | ICD-10-CM | POA: Diagnosis not present

## 2018-07-16 DIAGNOSIS — R05 Cough: Secondary | ICD-10-CM | POA: Diagnosis not present

## 2018-07-16 DIAGNOSIS — N183 Chronic kidney disease, stage 3 (moderate): Secondary | ICD-10-CM | POA: Diagnosis not present

## 2018-07-16 DIAGNOSIS — R4702 Dysphasia: Secondary | ICD-10-CM | POA: Diagnosis not present

## 2018-07-16 DIAGNOSIS — E7849 Other hyperlipidemia: Secondary | ICD-10-CM | POA: Diagnosis not present

## 2018-07-16 DIAGNOSIS — Z0000 Encounter for general adult medical examination without abnormal findings: Secondary | ICD-10-CM | POA: Diagnosis not present

## 2018-07-16 DIAGNOSIS — I2510 Atherosclerotic heart disease of native coronary artery without angina pectoris: Secondary | ICD-10-CM | POA: Diagnosis not present

## 2018-07-16 DIAGNOSIS — I10 Essential (primary) hypertension: Secondary | ICD-10-CM | POA: Diagnosis not present

## 2018-07-16 DIAGNOSIS — I509 Heart failure, unspecified: Secondary | ICD-10-CM | POA: Diagnosis not present

## 2018-07-17 ENCOUNTER — Emergency Department (HOSPITAL_BASED_OUTPATIENT_CLINIC_OR_DEPARTMENT_OTHER)
Admission: EM | Admit: 2018-07-17 | Discharge: 2018-07-17 | Disposition: A | Discharge status: Home / Self Care | Payer: Medicare HMO | Attending: Emergency Medicine | Admitting: Emergency Medicine

## 2018-07-17 ENCOUNTER — Emergency Department (HOSPITAL_BASED_OUTPATIENT_CLINIC_OR_DEPARTMENT_OTHER): Payer: Medicare HMO

## 2018-07-17 ENCOUNTER — Encounter (HOSPITAL_BASED_OUTPATIENT_CLINIC_OR_DEPARTMENT_OTHER): Payer: Self-pay

## 2018-07-17 ENCOUNTER — Other Ambulatory Visit: Payer: Self-pay

## 2018-07-17 DIAGNOSIS — R1084 Generalized abdominal pain: Secondary | ICD-10-CM | POA: Diagnosis not present

## 2018-07-17 DIAGNOSIS — Z5321 Procedure and treatment not carried out due to patient leaving prior to being seen by health care provider: Secondary | ICD-10-CM | POA: Insufficient documentation

## 2018-07-17 DIAGNOSIS — K5730 Diverticulosis of large intestine without perforation or abscess without bleeding: Secondary | ICD-10-CM | POA: Diagnosis not present

## 2018-07-17 DIAGNOSIS — R110 Nausea: Secondary | ICD-10-CM | POA: Diagnosis not present

## 2018-07-17 LAB — URINALYSIS, ROUTINE W REFLEX MICROSCOPIC
Bilirubin Urine: NEGATIVE
Glucose, UA: NEGATIVE mg/dL
Ketones, ur: NEGATIVE mg/dL
Leukocytes,Ua: NEGATIVE
Nitrite: NEGATIVE
Protein, ur: 100 mg/dL — AB
Specific Gravity, Urine: 1.025 (ref 1.005–1.030)
pH: 6 (ref 5.0–8.0)

## 2018-07-17 LAB — CBC
HCT: 44.5 % (ref 39.0–52.0)
Hemoglobin: 14 g/dL (ref 13.0–17.0)
MCH: 28.1 pg (ref 26.0–34.0)
MCHC: 31.5 g/dL (ref 30.0–36.0)
MCV: 89.4 fL (ref 80.0–100.0)
Platelets: 211 10*3/uL (ref 150–400)
RBC: 4.98 MIL/uL (ref 4.22–5.81)
RDW: 13.7 % (ref 11.5–15.5)
WBC: 9.8 10*3/uL (ref 4.0–10.5)
nRBC: 0 % (ref 0.0–0.2)

## 2018-07-17 LAB — COMPREHENSIVE METABOLIC PANEL
ALT: 20 U/L (ref 0–44)
AST: 22 U/L (ref 15–41)
Albumin: 4.4 g/dL (ref 3.5–5.0)
Alkaline Phosphatase: 67 U/L (ref 38–126)
Anion gap: 8 (ref 5–15)
BUN: 19 mg/dL (ref 8–23)
CO2: 27 mmol/L (ref 22–32)
Calcium: 9.6 mg/dL (ref 8.9–10.3)
Chloride: 99 mmol/L (ref 98–111)
Creatinine, Ser: 1.41 mg/dL — ABNORMAL HIGH (ref 0.61–1.24)
GFR calc Af Amer: 56 mL/min — ABNORMAL LOW (ref >60)
GFR calc non Af Amer: 49 mL/min — ABNORMAL LOW (ref >60)
Glucose, Bld: 108 mg/dL — ABNORMAL HIGH (ref 70–99)
Potassium: 4.2 mmol/L (ref 3.5–5.1)
Sodium: 134 mmol/L — ABNORMAL LOW (ref 135–145)
Total Bilirubin: 0.8 mg/dL (ref 0.3–1.2)
Total Protein: 7.7 g/dL (ref 6.5–8.1)

## 2018-07-17 LAB — URINALYSIS, MICROSCOPIC (REFLEX)

## 2018-07-17 LAB — LIPASE, BLOOD: Lipase: 43 U/L (ref 11–51)

## 2018-07-17 MED ORDER — SODIUM CHLORIDE 0.9 % IV BOLUS
500.0000 mL | Freq: Once | INTRAVENOUS | Status: AC
  Administered 2018-07-17: 500 mL via INTRAVENOUS

## 2018-07-17 MED ORDER — MORPHINE SULFATE (PF) 4 MG/ML IV SOLN
4.0000 mg | Freq: Once | INTRAVENOUS | Status: AC
  Administered 2018-07-17: 4 mg via INTRAVENOUS
  Filled 2018-07-17: qty 1

## 2018-07-17 MED ORDER — DICYCLOMINE HCL 20 MG PO TABS: 20.0000 mg | ORAL_TABLET | Freq: Two times a day (BID) | ORAL | 0 refills | Status: DC

## 2018-07-17 MED ORDER — SODIUM CHLORIDE 0.9% FLUSH
3.0000 mL | Freq: Once | INTRAVENOUS | Status: DC
  Filled 2018-07-17: qty 3

## 2018-07-17 NOTE — ED Notes (Signed)
Concerned about wait time.

## 2018-07-17 NOTE — Discharge Instructions (Signed)
Your work-up has been overall reassuring today.  Your blood work has been reassuring.  Creatinine mildly elevated at 1.4.  Have this rechecked.  Have discussed your CAT scan findings with you.  Cyst on your right kidney that can need follow-up.  No signs of any infection or obstruction in your belly.  We can take MiraLAX to help with constipation.  Have given you Bentyl for any abdominal cramping.  Continue the Protonix at home for the bloating feeling.  You will need GI follow-up for possible endoscopy for ongoing symptoms.  Make sure to follow with primary care doctor as well.  Return to the ED if you develop any chest pain, shortness of breath, worsening abdominal pain or for the reason.

## 2018-07-17 NOTE — ED Triage Notes (Signed)
C/o generalized abd pain, nausea x 2 days "feel like I need to have a bowel movement" with last normal BM this am-he did have issues with constipation 2 days prior to pain-states he was seen by PCP yesterday for annual check up-he mentioned the pain-pt had CXR-pt NAD-steady gait

## 2018-07-18 ENCOUNTER — Encounter: Payer: Self-pay | Admitting: *Deleted

## 2018-07-18 DIAGNOSIS — Z6827 Body mass index (BMI) 27.0-27.9, adult: Secondary | ICD-10-CM | POA: Diagnosis not present

## 2018-07-18 DIAGNOSIS — R4702 Dysphasia: Secondary | ICD-10-CM | POA: Diagnosis not present

## 2018-07-18 DIAGNOSIS — I10 Essential (primary) hypertension: Secondary | ICD-10-CM | POA: Diagnosis not present

## 2018-07-18 DIAGNOSIS — I2510 Atherosclerotic heart disease of native coronary artery without angina pectoris: Secondary | ICD-10-CM | POA: Diagnosis not present

## 2018-07-18 DIAGNOSIS — R109 Unspecified abdominal pain: Secondary | ICD-10-CM | POA: Diagnosis not present

## 2018-07-19 ENCOUNTER — Emergency Department (HOSPITAL_COMMUNITY): Payer: Medicare HMO

## 2018-07-19 ENCOUNTER — Encounter (HOSPITAL_COMMUNITY): Payer: Self-pay | Admitting: Emergency Medicine

## 2018-07-19 ENCOUNTER — Inpatient Hospital Stay (HOSPITAL_COMMUNITY)
Admission: EM | Admit: 2018-07-19 | Discharge: 2018-07-23 | DRG: 683 | Disposition: A | Discharge status: Home / Self Care | Payer: Medicare HMO | Source: Ambulatory Visit | Attending: Internal Medicine | Admitting: Internal Medicine

## 2018-07-19 ENCOUNTER — Ambulatory Visit: Payer: Medicare HMO | Admitting: Internal Medicine

## 2018-07-19 ENCOUNTER — Other Ambulatory Visit: Payer: Self-pay

## 2018-07-19 ENCOUNTER — Ambulatory Visit: Payer: Medicare HMO | Admitting: Gastroenterology

## 2018-07-19 DIAGNOSIS — K222 Esophageal obstruction: Secondary | ICD-10-CM | POA: Diagnosis present

## 2018-07-19 DIAGNOSIS — K449 Diaphragmatic hernia without obstruction or gangrene: Secondary | ICD-10-CM | POA: Diagnosis present

## 2018-07-19 DIAGNOSIS — Z905 Acquired absence of kidney: Secondary | ICD-10-CM | POA: Diagnosis not present

## 2018-07-19 DIAGNOSIS — I110 Hypertensive heart disease with heart failure: Secondary | ICD-10-CM | POA: Diagnosis present

## 2018-07-19 DIAGNOSIS — R109 Unspecified abdominal pain: Secondary | ICD-10-CM | POA: Diagnosis not present

## 2018-07-19 DIAGNOSIS — E860 Dehydration: Secondary | ICD-10-CM | POA: Diagnosis present

## 2018-07-19 DIAGNOSIS — R918 Other nonspecific abnormal finding of lung field: Secondary | ICD-10-CM | POA: Diagnosis not present

## 2018-07-19 DIAGNOSIS — R52 Pain, unspecified: Secondary | ICD-10-CM

## 2018-07-19 DIAGNOSIS — R55 Syncope and collapse: Secondary | ICD-10-CM | POA: Diagnosis present

## 2018-07-19 DIAGNOSIS — Z87891 Personal history of nicotine dependence: Secondary | ICD-10-CM

## 2018-07-19 DIAGNOSIS — R7989 Other specified abnormal findings of blood chemistry: Secondary | ICD-10-CM | POA: Diagnosis not present

## 2018-07-19 DIAGNOSIS — I4891 Unspecified atrial fibrillation: Secondary | ICD-10-CM | POA: Diagnosis present

## 2018-07-19 DIAGNOSIS — K2970 Gastritis, unspecified, without bleeding: Secondary | ICD-10-CM | POA: Diagnosis present

## 2018-07-19 DIAGNOSIS — K5790 Diverticulosis of intestine, part unspecified, without perforation or abscess without bleeding: Secondary | ICD-10-CM | POA: Diagnosis present

## 2018-07-19 DIAGNOSIS — I2510 Atherosclerotic heart disease of native coronary artery without angina pectoris: Secondary | ICD-10-CM | POA: Diagnosis present

## 2018-07-19 DIAGNOSIS — R0902 Hypoxemia: Secondary | ICD-10-CM

## 2018-07-19 DIAGNOSIS — Z880 Allergy status to penicillin: Secondary | ICD-10-CM | POA: Diagnosis not present

## 2018-07-19 DIAGNOSIS — R4702 Dysphasia: Secondary | ICD-10-CM | POA: Diagnosis present

## 2018-07-19 DIAGNOSIS — S0990XA Unspecified injury of head, initial encounter: Secondary | ICD-10-CM | POA: Diagnosis not present

## 2018-07-19 DIAGNOSIS — R110 Nausea: Secondary | ICD-10-CM | POA: Diagnosis not present

## 2018-07-19 DIAGNOSIS — N400 Enlarged prostate without lower urinary tract symptoms: Secondary | ICD-10-CM

## 2018-07-19 DIAGNOSIS — N179 Acute kidney failure, unspecified: Secondary | ICD-10-CM | POA: Diagnosis present

## 2018-07-19 DIAGNOSIS — E785 Hyperlipidemia, unspecified: Secondary | ICD-10-CM | POA: Diagnosis present

## 2018-07-19 DIAGNOSIS — R140 Abdominal distension (gaseous): Secondary | ICD-10-CM

## 2018-07-19 DIAGNOSIS — I252 Old myocardial infarction: Secondary | ICD-10-CM

## 2018-07-19 DIAGNOSIS — R630 Anorexia: Secondary | ICD-10-CM | POA: Diagnosis present

## 2018-07-19 DIAGNOSIS — M5136 Other intervertebral disc degeneration, lumbar region: Secondary | ICD-10-CM | POA: Diagnosis not present

## 2018-07-19 DIAGNOSIS — I5022 Chronic systolic (congestive) heart failure: Secondary | ICD-10-CM | POA: Diagnosis present

## 2018-07-19 DIAGNOSIS — I951 Orthostatic hypotension: Secondary | ICD-10-CM | POA: Diagnosis present

## 2018-07-19 DIAGNOSIS — R296 Repeated falls: Secondary | ICD-10-CM | POA: Diagnosis present

## 2018-07-19 DIAGNOSIS — R778 Other specified abnormalities of plasma proteins: Secondary | ICD-10-CM

## 2018-07-19 DIAGNOSIS — M47816 Spondylosis without myelopathy or radiculopathy, lumbar region: Secondary | ICD-10-CM | POA: Diagnosis not present

## 2018-07-19 DIAGNOSIS — I959 Hypotension, unspecified: Secondary | ICD-10-CM | POA: Diagnosis not present

## 2018-07-19 DIAGNOSIS — R5381 Other malaise: Secondary | ICD-10-CM | POA: Diagnosis present

## 2018-07-19 DIAGNOSIS — E871 Hypo-osmolality and hyponatremia: Secondary | ICD-10-CM | POA: Diagnosis present

## 2018-07-19 DIAGNOSIS — I480 Paroxysmal atrial fibrillation: Secondary | ICD-10-CM | POA: Diagnosis not present

## 2018-07-19 DIAGNOSIS — K22719 Barrett's esophagus with dysplasia, unspecified: Secondary | ICD-10-CM | POA: Diagnosis not present

## 2018-07-19 DIAGNOSIS — R42 Dizziness and giddiness: Secondary | ICD-10-CM | POA: Diagnosis not present

## 2018-07-19 DIAGNOSIS — K5900 Constipation, unspecified: Secondary | ICD-10-CM | POA: Diagnosis not present

## 2018-07-19 DIAGNOSIS — E875 Hyperkalemia: Secondary | ICD-10-CM | POA: Diagnosis present

## 2018-07-19 DIAGNOSIS — Z8249 Family history of ischemic heart disease and other diseases of the circulatory system: Secondary | ICD-10-CM

## 2018-07-19 DIAGNOSIS — Z79899 Other long term (current) drug therapy: Secondary | ICD-10-CM

## 2018-07-19 DIAGNOSIS — Z7982 Long term (current) use of aspirin: Secondary | ICD-10-CM

## 2018-07-19 DIAGNOSIS — Z7902 Long term (current) use of antithrombotics/antiplatelets: Secondary | ICD-10-CM

## 2018-07-19 DIAGNOSIS — K2980 Duodenitis without bleeding: Secondary | ICD-10-CM | POA: Diagnosis not present

## 2018-07-19 DIAGNOSIS — I351 Nonrheumatic aortic (valve) insufficiency: Secondary | ICD-10-CM | POA: Diagnosis not present

## 2018-07-19 DIAGNOSIS — R531 Weakness: Secondary | ICD-10-CM | POA: Diagnosis present

## 2018-07-19 DIAGNOSIS — I5020 Unspecified systolic (congestive) heart failure: Secondary | ICD-10-CM | POA: Diagnosis present

## 2018-07-19 DIAGNOSIS — S199XXA Unspecified injury of neck, initial encounter: Secondary | ICD-10-CM | POA: Diagnosis not present

## 2018-07-19 LAB — CBC WITH DIFFERENTIAL/PLATELET
Abs Immature Granulocytes: 0.03 K/uL (ref 0.00–0.07)
Basophils Absolute: 0.1 K/uL (ref 0.0–0.1)
Basophils Relative: 1 %
Eosinophils Absolute: 0.1 K/uL (ref 0.0–0.5)
Eosinophils Relative: 1 %
HCT: 42.7 % (ref 39.0–52.0)
Hemoglobin: 13.3 g/dL (ref 13.0–17.0)
Immature Granulocytes: 0 %
Lymphocytes Relative: 13 %
Lymphs Abs: 1.2 K/uL (ref 0.7–4.0)
MCH: 27.3 pg (ref 26.0–34.0)
MCHC: 31.1 g/dL (ref 30.0–36.0)
MCV: 87.5 fL (ref 80.0–100.0)
Monocytes Absolute: 0.8 K/uL (ref 0.1–1.0)
Monocytes Relative: 9 %
Neutro Abs: 6.6 K/uL (ref 1.7–7.7)
Neutrophils Relative %: 76 %
Platelets: 187 K/uL (ref 150–400)
RBC: 4.88 MIL/uL (ref 4.22–5.81)
RDW: 13.8 % (ref 11.5–15.5)
WBC: 8.8 K/uL (ref 4.0–10.5)
nRBC: 0 % (ref 0.0–0.2)

## 2018-07-19 LAB — URINALYSIS, ROUTINE W REFLEX MICROSCOPIC
Bacteria, UA: NONE SEEN
Bilirubin Urine: NEGATIVE
Glucose, UA: NEGATIVE mg/dL
Ketones, ur: NEGATIVE mg/dL
Leukocytes,Ua: NEGATIVE
Nitrite: NEGATIVE
Protein, ur: 30 mg/dL — AB
Specific Gravity, Urine: 1.006 (ref 1.005–1.030)
pH: 6 (ref 5.0–8.0)

## 2018-07-19 LAB — BASIC METABOLIC PANEL
Anion gap: 14 (ref 5–15)
Anion gap: 9 (ref 5–15)
BUN: 27 mg/dL — ABNORMAL HIGH (ref 8–23)
BUN: 25 mg/dL — ABNORMAL HIGH (ref 8–23)
CO2: 20 mmol/L — ABNORMAL LOW (ref 22–32)
CO2: 24 mmol/L (ref 22–32)
Calcium: 8.9 mg/dL (ref 8.9–10.3)
Calcium: 8.8 mg/dL — ABNORMAL LOW (ref 8.9–10.3)
Chloride: 97 mmol/L — ABNORMAL LOW (ref 98–111)
Chloride: 102 mmol/L (ref 98–111)
Creatinine, Ser: 2.13 mg/dL — ABNORMAL HIGH (ref 0.61–1.24)
Creatinine, Ser: 2.17 mg/dL — ABNORMAL HIGH (ref 0.61–1.24)
GFR calc Af Amer: 34 mL/min — ABNORMAL LOW (ref >60)
GFR calc Af Amer: 34 mL/min — ABNORMAL LOW (ref >60)
GFR calc non Af Amer: 30 mL/min — ABNORMAL LOW (ref >60)
GFR calc non Af Amer: 29 mL/min — ABNORMAL LOW (ref >60)
Glucose, Bld: 99 mg/dL (ref 70–99)
Glucose, Bld: 89 mg/dL (ref 70–99)
Potassium: 6.1 mmol/L — ABNORMAL HIGH (ref 3.5–5.1)
Potassium: 4 mmol/L (ref 3.5–5.1)
Sodium: 131 mmol/L — ABNORMAL LOW (ref 135–145)
Sodium: 135 mmol/L (ref 135–145)

## 2018-07-19 LAB — TROPONIN I: Troponin I: 0.05 ng/mL (ref <0.03)

## 2018-07-19 MED ORDER — ONDANSETRON HCL 4 MG PO TABS
4.0000 mg | ORAL_TABLET | Freq: Four times a day (QID) | ORAL | Status: DC | PRN
  Administered 2018-07-20 – 2018-07-21 (×3): 4 mg via ORAL
  Filled 2018-07-19 (×3): qty 1

## 2018-07-19 MED ORDER — INSULIN ASPART 100 UNIT/ML ~~LOC~~ SOLN
5.0000 [IU] | Freq: Once | SUBCUTANEOUS | Status: AC
  Administered 2018-07-19: 5 [IU] via INTRAVENOUS

## 2018-07-19 MED ORDER — ACETAMINOPHEN 325 MG PO TABS: 650.0000 mg | ORAL_TABLET | Freq: Four times a day (QID) | ORAL | Status: DC | PRN

## 2018-07-19 MED ORDER — SODIUM CHLORIDE 0.9% FLUSH
3.0000 mL | Freq: Two times a day (BID) | INTRAVENOUS | Status: DC
  Administered 2018-07-19 – 2018-07-20 (×2): 3 mL via INTRAVENOUS

## 2018-07-19 MED ORDER — HEPARIN SODIUM (PORCINE) 5000 UNIT/ML IJ SOLN
5000.0000 [IU] | Freq: Three times a day (TID) | INTRAMUSCULAR | Status: DC
  Administered 2018-07-19 – 2018-07-22 (×8): 5000 [IU] via SUBCUTANEOUS
  Filled 2018-07-19 (×8): qty 1

## 2018-07-19 MED ORDER — DICYCLOMINE HCL 10 MG PO CAPS: 10.0000 mg | ORAL_CAPSULE | Freq: Three times a day (TID) | ORAL | Status: DC | PRN

## 2018-07-19 MED ORDER — TRAMADOL HCL 50 MG PO TABS
50.0000 mg | ORAL_TABLET | Freq: Four times a day (QID) | ORAL | Status: DC | PRN
  Administered 2018-07-20: 50 mg via ORAL
  Filled 2018-07-19: qty 1

## 2018-07-19 MED ORDER — SODIUM CHLORIDE 0.9 % IV BOLUS
1000.0000 mL | Freq: Once | INTRAVENOUS | Status: AC
  Administered 2018-07-19: 1000 mL via INTRAVENOUS

## 2018-07-19 MED ORDER — POLYETHYLENE GLYCOL 3350 17 G PO PACK: 17.0000 g | PACK | Freq: Every day | ORAL | Status: DC | PRN

## 2018-07-19 MED ORDER — SODIUM CHLORIDE 0.9 % IV SOLN
INTRAVENOUS | Status: DC
  Administered 2018-07-19 – 2018-07-20 (×2): via INTRAVENOUS

## 2018-07-19 MED ORDER — DEXTROSE 50 % IV SOLN
1.0000 | Freq: Once | INTRAVENOUS | Status: AC
  Administered 2018-07-19: 50 mL via INTRAVENOUS
  Filled 2018-07-19: qty 50

## 2018-07-19 MED ORDER — ACETAMINOPHEN 650 MG RE SUPP: 650.0000 mg | Freq: Four times a day (QID) | RECTAL | Status: DC | PRN

## 2018-07-19 MED ORDER — ONDANSETRON HCL 4 MG/2ML IJ SOLN: 4.0000 mg | Freq: Four times a day (QID) | INTRAMUSCULAR | Status: DC | PRN

## 2018-07-19 MED ORDER — SODIUM ZIRCONIUM CYCLOSILICATE 5 G PO PACK
5.0000 g | PACK | Freq: Once | ORAL | Status: AC
  Administered 2018-07-19: 5 g via ORAL
  Filled 2018-07-19: qty 1

## 2018-07-19 NOTE — ED Provider Notes (Signed)
[facility] EMERGENCY DEPARTMENT Provider Note   CSN: [id number] Arrival date & time: 07/19/18  1323    History   Chief Complaint Chief Complaint  Patient presents with   Hypotension   Weakness    HPI Maurice Barron is a 73 y.o. male.     Pt presents to the ED with wife and daughter via EMS after hypotension noted in PCP's office. Pt reports he was having diffuse abdominal pain 4-5 days ago and seen at [facility] for same; had workup done with no acute findings and was discharged home with Rx Bentyl. Pt went to see his PCP again yesterday due to continued pain and was prescribed 50 mg Tramadol and 5 mg Flexeril which he took last night around 8 PM. Reports his pain improved but when he woke up this morning he felt lightheaded and dizzy. Wife reports pt was stumbling around. They went back to PCP's office after calling him and letting him be aware of symptoms; while in the office pt was hypotensive and sent to the ED for further evaluation. Pt was given 300 CCs NS bolus prior to coming to the ED; BP on arrival 106/54. Pt's only complaint currently besides lightheadedness is constipation; last BM 2 days ago. Denies any pain currently, nausea, vomiting, chest pain, shortness of breath, vision changes, unilateral weakness or numbness.    Weakness  Associated symptoms: dizziness   Associated symptoms: no abdominal pain, no chest pain, no diarrhea, no fever, no headaches, no nausea, no shortness of breath and no vomiting     Past Medical History:  Diagnosis Date   Anxiety    CHF (congestive heart failure) (HCC)    EF 35%. WITH MEDICATION HIS EF IS NOW 55%   Coronary artery disease    ruptured plaque in the LAD - started Plavix at that point.   Diverticulosis    Enlarged prostate    Hyperlipidemia    PVC's (premature ventricular contractions)    Renal cyst     Patient Active Problem List   Diagnosis Date Noted   Coronary artery disease 12/01/2010    Congestive heart failure ([place]) 12/01/2010   Hyperlipidemia 12/01/2010    Past Surgical History:  Procedure Laterality Date   CARDIAC CATHETERIZATION  12/04/2003   EF 30-35%   CARDIOVASCULAR STRESS TEST  08/20/2006   EF 50%   HYDROCELE EXCISION / REPAIR     NEPHRECTOMY     TONSILLECTOMY     US ECHOCARDIOGRAPHY  08/07/2005   EF 50-55%        Home Medications    Prior to Admission medications   Medication Sig Start Date End Date Taking? Authorizing Provider  aspirin 81 MG tablet Take 81 mg by mouth daily.      [provider]  carvedilol (COREG) 25 MG tablet Take 25 mg by mouth 2 (two) times daily with a meal.      [provider]  clopidogrel (PLAVIX) 75 MG tablet Take 75 mg by mouth daily.      [provider]  dicyclomine (BENTYL) 20 MG tablet Take 1 tablet (20 mg total) by mouth 2 (two) times daily. 07/17/18   Doristine Devoid, PA-C  fish oil-omega-3 fatty acids 1000 MG capsule Take 1 g by mouth daily.      [provider]  irbesartan (AVAPRO) 75 MG tablet Take 75 mg by mouth daily.    [provider]  rosuvastatin (CRESTOR) 40 MG tablet Take 40 mg by mouth  daily.      [provider]    Family History Family History  Problem Relation Age of Onset   Heart disease Mother    Sarcoidosis Mother    Heart attack Father    Heart attack Brother    Coronary artery disease Brother     Social History Social History   Tobacco Use   Smoking status: Former Smoker    Last attempt to quit: 05/08/1992    Years since quitting: 26.2   Smokeless tobacco: Never Used  Substance Use Topics   Alcohol use: Yes    Comment: occ   Drug use: No     Allergies   Penicillins   Review of Systems Review of Systems  Constitutional: Negative for chills and fever.  Eyes: Negative for visual disturbance.  Respiratory: Negative for shortness of breath.   Cardiovascular: Negative for chest pain.  Gastrointestinal:  Positive for constipation. Negative for abdominal pain, diarrhea, nausea and vomiting.  Musculoskeletal: Negative for back pain and neck pain.  Skin: Positive for wound.  Neurological: Positive for dizziness, weakness (generalized) and light-headedness. Negative for speech difficulty, numbness and headaches.  Hematological: Bruises/bleeds easily.  Psychiatric/Behavioral: Negative for confusion.     Physical Exam Updated Vital Signs BP (!) 101/53    Pulse 70    Temp 97.7 F (36.5 C) (Oral)    Resp 17    Ht 5\' 9"  (1.753 m)    Wt 81.2 kg    SpO2 95%    BMI 26.43 kg/m   Physical Exam Vitals signs and nursing note reviewed.  Constitutional:      Appearance: He is not ill-appearing or diaphoretic.  HENT:     Head: Normocephalic.     Comments: Small 2 cm linear abrasion to upper lip Eyes:     Conjunctiva/sclera: Conjunctivae normal.     Pupils: Pupils are equal, round, and reactive to light.  Neck:     Musculoskeletal: Normal range of motion and neck supple. No neck rigidity or muscular tenderness.  Cardiovascular:     Rate and Rhythm: Normal rate and regular rhythm.  Pulmonary:     Effort: Pulmonary effort is normal.     Breath sounds: Normal breath sounds.  Abdominal:     Palpations: Abdomen is soft.     Tenderness: There is no abdominal tenderness.  Musculoskeletal: Normal range of motion.        General: No swelling or tenderness.  Skin:    General: Skin is warm and dry.  Neurological:     General: No focal deficit present.     Mental Status: He is alert.     Sensory: No sensory deficit.     Motor: No weakness.      ED Treatments / Results  Labs (all labs ordered are listed, but only abnormal results are displayed) Labs Reviewed  BASIC METABOLIC PANEL - Abnormal; Notable for the following components:      Result Value   Sodium 131 (*)    Potassium 6.1 (*)    Chloride 97 (*)    CO2 20 (*)    BUN 27 (*)    Creatinine, Ser 2.13 (*)    GFR calc non Af Amer 30 (*)     GFR calc Af Amer 34 (*)    All other components within normal limits  TROPONIN I - Abnormal; Notable for the following components:   Troponin I 0.05 (*)    All other components within normal limits  CBC WITH  DIFFERENTIAL/PLATELET  URINALYSIS, ROUTINE W REFLEX MICROSCOPIC  H. PYLORI ANTIBODY, IGG    EKG EKG Interpretation  Date/Time:  Friday July 19 2018 13:25:19 EDT Ventricular Rate:  69 PR Interval:    QRS Duration: 105 QT Interval:  417 QTC Calculation: 447 R Axis:   7 Text Interpretation:  Sinus rhythm Nonspecific T wave abnormality No previous tracing Confirmed by Lajean Saver [phone]) on 07/19/2018 1:28:24 PM Also confirmed by Gareth Morgan [phone])  on 07/19/2018 3:51:30 PM   Radiology Ct Abdomen Pelvis Wo Contrast  Result Date: 07/17/2018 CLINICAL DATA:  Acute onset of generalized abdominal pain and nausea. Constipation. EXAM: CT ABDOMEN AND PELVIS WITHOUT CONTRAST TECHNIQUE: Multidetector CT imaging of the abdomen and pelvis was performed following the standard protocol without IV contrast. COMPARISON:  None. FINDINGS: Lower chest: Minimal bibasilar scarring is noted. The visualized lung apices are otherwise clear. Hepatobiliary: The liver is unremarkable in appearance. The gallbladder is unremarkable in appearance. The common bile duct remains normal in caliber. Pancreas: The pancreas is within normal limits. Spleen: The spleen is unremarkable in appearance. Adrenals/Urinary Tract: The adrenal glands are unremarkable. Mild right-sided perinephric stranding is noted. A small right renal cyst is noted. The right kidney is otherwise unremarkable. There is no evidence of hydronephrosis. The patient is status post left-sided nephrectomy. No renal or ureteral stones are identified. Stomach/Bowel: The stomach is unremarkable in appearance. The small bowel is within normal limits. The appendix is normal in caliber, without evidence of appendicitis. Scattered diverticulosis is noted  along the descending and sigmoid colon, without evidence of diverticulitis. A small amount of stool is noted in the colon. Vascular/Lymphatic: Scattered calcification is seen along the abdominal aorta and its branches. The abdominal aorta is otherwise grossly unremarkable. The inferior vena cava is grossly unremarkable. No retroperitoneal lymphadenopathy is seen. No pelvic sidewall lymphadenopathy is identified. Reproductive: The bladder is mildly distended. There is impression on the base of the bladder by the prostate. The prostate is enlarged, measuring 5.6 cm in transverse dimension. Other: No additional soft tissue abnormalities are seen. Musculoskeletal: No acute osseous abnormalities are identified. The visualized musculature is unremarkable in appearance. IMPRESSION: 1. No acute abnormality seen to explain the patient's symptoms. Small amount of stool noted in the colon. 2. Scattered diverticulosis along the descending and sigmoid colon, without evidence of diverticulitis. 3. Small right renal cyst noted. 4. Enlarged prostate, with impression on the base of the bladder by the prostate. Would correlate with PSA. Aortic Atherosclerosis (ICD10-I70.0). Electronically Signed   By: Garald Balding M.D.   On: 07/17/2018 17:52   Ct Head Wo Contrast  Result Date: 07/19/2018 CLINICAL DATA:  Syncope and falls. EXAM: CT HEAD WITHOUT CONTRAST CT CERVICAL SPINE WITHOUT CONTRAST TECHNIQUE: Multidetector CT imaging of the head and cervical spine was performed following the standard protocol without intravenous contrast. Multiplanar CT image reconstructions of the cervical spine were also generated. COMPARISON:  None. FINDINGS: CT HEAD FINDINGS Brain: No evidence of acute infarction, hemorrhage, hydrocephalus, extra-axial collection or mass lesion/mass effect. Vascular: No hyperdense vessel or unexpected calcification. Skull: Normal. Negative for fracture or focal lesion. Sinuses/Orbits: No acute finding. Other: None. CT  CERVICAL SPINE FINDINGS Alignment: Normal. Skull base and vertebrae: No acute fracture. No primary bone lesion or focal pathologic process. Soft tissues and spinal canal: No prevertebral fluid or swelling. No visible canal hematoma. Disc levels: Severe degenerative disc disease is noted at C5-6 and C6-7 with anterior osteophyte formation. Upper chest: Negative. Other: Degenerative changes are seen involving posterior  facet joints bilaterally. IMPRESSION: Normal head CT. Multilevel degenerative disc disease is noted in the cervical spine. No acute abnormality is seen. Electronically Signed   By: Marijo Conception, M.D.   On: 07/19/2018 15:05   Ct Cervical Spine Wo Contrast  Result Date: 07/19/2018 CLINICAL DATA:  Syncope and falls. EXAM: CT HEAD WITHOUT CONTRAST CT CERVICAL SPINE WITHOUT CONTRAST TECHNIQUE: Multidetector CT imaging of the head and cervical spine was performed following the standard protocol without intravenous contrast. Multiplanar CT image reconstructions of the cervical spine were also generated. COMPARISON:  None. FINDINGS: CT HEAD FINDINGS Brain: No evidence of acute infarction, hemorrhage, hydrocephalus, extra-axial collection or mass lesion/mass effect. Vascular: No hyperdense vessel or unexpected calcification. Skull: Normal. Negative for fracture or focal lesion. Sinuses/Orbits: No acute finding. Other: None. CT CERVICAL SPINE FINDINGS Alignment: Normal. Skull base and vertebrae: No acute fracture. No primary bone lesion or focal pathologic process. Soft tissues and spinal canal: No prevertebral fluid or swelling. No visible canal hematoma. Disc levels: Severe degenerative disc disease is noted at C5-6 and C6-7 with anterior osteophyte formation. Upper chest: Negative. Other: Degenerative changes are seen involving posterior facet joints bilaterally. IMPRESSION: Normal head CT. Multilevel degenerative disc disease is noted in the cervical spine. No acute abnormality is seen. Electronically  Signed   By: Marijo Conception, M.D.   On: 07/19/2018 15:05    Procedures Procedures (including critical care time)  Medications Ordered in ED Medications  insulin aspart (novoLOG) injection 5 Units (has no administration in time range)  dextrose 50 % solution 50 mL (has no administration in time range)  sodium zirconium cyclosilicate (LOKELMA) packet 5 g (has no administration in time range)  sodium chloride 0.9 % bolus 1,000 mL (1,000 mLs Intravenous New Bag/Given 07/19/18 1509)     Initial Impression / Assessment and Plan / ED Course  I have reviewed the triage vital signs and the nursing notes.  Pertinent labs & imaging results that were available during my care of the patient were reviewed by me and considered in my medical decision making (see chart for details).    Pt presents with hypotension and generalized weakness that occurred today at PCP's office. He reports having diffuse abdominal pain x 4-5 days; seen at [place] for same and had full workup including CT A/P which showed 1. No acute abnormality seen to explain the patient's symptoms. Small amount of stool noted in the colon. 2. Scattered diverticulosis along the descending and sigmoid colon, without evidence of diverticulitis. 3. Small right renal cyst noted. 4. Enlarged prostate, with impression on the base of the bladder by the prostate. Would correlate with PSA. Pt was given follow up with GI, Rx Bentyl, and discharged home. Saw PCP yesterday for continued pain; given 50 mg Tramadol and 5 mg Flexeril last taken at 8 PM last night; felt lightheaded this morning; saw PCP again and was found to be hypotensive. Given 300 CC's NS bolus and sent to the ED for eval. During that time he fell and hit his upper lip; anticoagulated on Plavix. BP in the room 113/65; reports he feels fine until he attempts to stand; pt did take BP meds this morning. Cannot rule out head bleed given anticoagulated; will get head CT. Do not believe pt needs  fluids at the moment given improvement in BP; pt also has hx of CHF and do not want to fluid overload him. Basic bloodwork ordered as well. Will reevaluate once labs return.   3:20 PM Discussed  case with Dr. Ashok Cordia who had ordered Troponin and H pylori antigen given pt and family's concerns regarding abdominal pain; troponin elevated at 0.05; will get repeat troponin. CT Head and CT C spine without acute findings.   3:45 PM Pt with elevated creatinine 2.13 and potassium 6.1. Creatinine 2 days ago 1.41; possibly increased due to contrast dye given at [facility]; unsure why potassium is elevated. Fluids on board. Case signed out to Dr. Billy Fischer who will admit patient.   Lab Results  Component Value Date   CREATININE 2.13 (H) 07/19/2018   CREATININE 1.41 (H) 07/17/2018   BMP Latest Ref Rng & Units 07/19/2018 07/17/2018  Glucose 70 - 99 mg/dL 99 108(H)  BUN 8 - 23 mg/dL 27(H) 19  Creatinine 0.61 - 1.24 mg/dL 2.13(H) 1.41(H)  Sodium 135 - 145 mmol/L 131(L) 134(L)  Potassium 3.5 - 5.1 mmol/L 6.1(H) 4.2  Chloride 98 - 111 mmol/L 97(L) 99  CO2 22 - 32 mmol/L 20(L) 27  Calcium 8.9 - 10.3 mg/dL 8.9 9.6          Final Clinical Impressions(s) / ED Diagnoses   Final diagnoses:  AKI (acute kidney injury) ([place])  Troponin level elevated  Hyperkalemia    ED Discharge Orders    None       Eustaquio Maize, PA-C 07/19/18 1601    Lajean Saver, MD 07/19/18 1636

## 2018-07-19 NOTE — ED Provider Notes (Signed)
  Physical Exam  BP (!) 147/70   Pulse 72   Temp 97.7 F (36.5 C) (Oral)   Resp (!) 25   Ht 5\' 9"  (1.753 m)   Wt 81.2 kg   SpO2 100%   BMI 26.43 kg/m   Physical Exam Constitutional:      General: He is not in acute distress.    Appearance: Normal appearance. He is normal weight. He is not ill-appearing, toxic-appearing or diaphoretic.  HENT:     Head: Normocephalic.  Cardiovascular:     Rate and Rhythm: Normal rate.     Pulses: Normal pulses.  Pulmonary:     Effort: Pulmonary effort is normal. No respiratory distress.  Neurological:     Mental Status: He is alert.     ED Course/Procedures     .Critical Care Performed by: Gareth Morgan, MD Authorized by: Gareth Morgan, MD   Critical care provider statement:    Critical care time (minutes):  30   Critical care was necessary to treat or prevent imminent or life-threatening deterioration of the following conditions:  Metabolic crisis   Critical care was time spent personally by me on the following activities:  Development of treatment plan with patient or surrogate, obtaining history from patient or surrogate, ordering and review of laboratory studies and ordering and performing treatments and interventions    MDM  Received care at [facility] from Dr. Ashok Cordia and PA Eustaquio Maize.  Presented with syncope in setting of 4-5 days of mid upper abdominal pain, nausea and decreased po intake.  He had syncopal event when going from sitting to standing. Suspect dehydration. Troponin lelevated, will continue to monitor, epigastric pain described previously not present at this time and by history sounds less likely cardiac (nonexertional, no associated dyspnea.)  Labs show AKI with hyperkalemia 6.1.  No TW changes.  Will treat hyperkalemia with insulin, dextrose, lokelma, continue hydration, and admit for further care.       Gareth Morgan, MD 07/20/18 1022

## 2018-07-19 NOTE — H&P (Signed)
History and Physical    Maurice Barron XHB:[id number] DOB: 1945/12/06 DOA: 07/19/2018  PCP: [name], MD  Patient coming from: Dr. Danna Hefty office  Nephrologist: Dr. Raquel Sarna Had an appointment to see Dr. Hilarie Fredrickson today he was unable to make it (GI)  I have personally briefly reviewed patient's old medical records in [place]  Chief Complaint: Syncope at primary care physician's office today  HPI: Maurice Barron is a 73 y.o. male with medical history significant of systolic congestive heart failure, coronary artery disease, 1 kidney, enlarged prostate, diverticulosis, hyperlipidemia who is to the emergency department brought in by [facility] due to increased weakness, multiple falls and a near syncopal episode at the doctor's office.  EMS arrived he was hypotensive with a blood pressure of 68/52.  It went up to 99/60 3:03 100 mL of normal saline.  Patient denies any pain and states that his been taking medication due to abdominal pain he has had for 4 days.  Had a poor p.o. intake this week.  He was seen at the emergency department on 11 March feeling like he needed to have a bowel movement.  Had problems with constipation for 2 days prior to presentation.  A CT scan was obtained and was markable only for scattered diverticulosis but no evidence of reticulitis.  He did have an enlarged prostate.  Patient was prescribed Bentyl for his abdominal discomfort went to see his PCP again yesterday due to continued pain was given 50 mg of tramadol and 5 mg of Flexeril.  He took Bentyl Flexeril and tramadol last night around 8 PM felt well and his pain had improved when he woke up this morning however he felt lightheaded and dizzy.  Stated he was stumbling about.  Went to the PCPs office had an episode of syncope there was noted to be hypotensive felt to have orthostasis because his pressure was so low upon standing was referred here for further evaluation and management.  On the 11th  his creatinine was 1.41 and today it is elevated at 2.13.  He is also slightly hyponatremic.  His potassium is elevated at 6.1 as well.  Is a mildly elevated troponin as well.  Is referred to me for further evaluation and management of orthostatic hypotension with acute kidney injury  ED Course: CT scan of the head and cervical spine were obtained and were unremarkable except for multilevel degenerative disc disease of the C-spine.  Review of Systems: As per HPI otherwise all other systems reviewed and  negative.   Past Medical History:  Diagnosis Date   Anxiety    CHF (congestive heart failure) (HCC)    EF 35%. WITH MEDICATION HIS EF IS NOW 55%   Coronary artery disease    ruptured plaque in the LAD - started Plavix at that point.   Diverticulosis    Enlarged prostate    Hyperlipidemia    PVC's (premature ventricular contractions)    Renal cyst     Past Surgical History:  Procedure Laterality Date   CARDIAC CATHETERIZATION  12/04/2003   EF 30-35%   CARDIOVASCULAR STRESS TEST  08/20/2006   EF 50%   HYDROCELE EXCISION / REPAIR     NEPHRECTOMY     TONSILLECTOMY     US ECHOCARDIOGRAPHY  08/07/2005   EF 50-55%    Social History   Social History Narrative   Not on file     reports that he quit smoking about 26 years ago. He has  never used smokeless tobacco. He reports current alcohol use. He reports that he does not use drugs.  Allergies  Allergen Reactions   Penicillins Other (See Comments)    Did it involve swelling of the face/tongue/throat, SOB, or low BP? Unknown Did it involve sudden or severe rash/hives, skin peeling, or any reaction on the inside of your mouth or nose? Unknown Did you need to seek medical attention at a hospital or doctor's office? Unknown When did it last happen?toddler? If all above answers are NO, may proceed with cephalosporin use.    Family History  Problem Relation Age of Onset   Heart disease Mother    Sarcoidosis  Mother    Heart attack Father    Heart attack Brother    Coronary artery disease Brother      Prior to Admission medications   Medication Sig Start Date End Date Taking? Authorizing Provider  aspirin 81 MG tablet Take 81 mg by mouth daily.      [provider]  carvedilol (COREG) 25 MG tablet Take 25 mg by mouth 2 (two) times daily with a meal.      [provider]  clopidogrel (PLAVIX) 75 MG tablet Take 75 mg by mouth daily.      [provider]  dicyclomine (BENTYL) 20 MG tablet Take 1 tablet (20 mg total) by mouth 2 (two) times daily. 07/17/18   Doristine Devoid, PA-C  fish oil-omega-3 fatty acids 1000 MG capsule Take 1 g by mouth daily.      [provider]  irbesartan (AVAPRO) 75 MG tablet Take 75 mg by mouth daily.    [provider]  rosuvastatin (CRESTOR) 40 MG tablet Take 40 mg by mouth daily.      [provider]    Physical Exam:  Constitutional: NAD, calm, comfortable Vitals:   07/19/18 1600 07/19/18 1615 07/19/18 1630 07/19/18 1800  BP: (!) 147/70 (!) 141/62 133/68   Pulse: 72 82 82   Resp: (!) 25 13 17    Temp:      TempSrc:      SpO2: 100% 98% 97%   Weight:    81.8 kg  Height:    5\' 7"  (1.702 m)   Eyes: PERRL, lids and conjunctivae normal ENMT: Mucous membranes are moist. Posterior pharynx clear of any exudate or lesions.Normal dentition.  Neck: normal, supple, no masses, no thyromegaly Respiratory: clear to auscultation bilaterally, no wheezing, no crackles. Normal respiratory effort. No accessory muscle use.  Cardiovascular: Regular rate and rhythm, no murmurs / rubs / gallops. No extremity edema. 2+ pedal pulses. No carotid bruits.  Abdomen: no tenderness, no masses palpated. No hepatosplenomegaly. Bowel sounds positive.  Musculoskeletal: no clubbing / cyanosis. No joint deformity upper and lower extremities. Good ROM, no contractures. Normal muscle tone.  Skin: no rashes, lesions, ulcers. No  induration Neurologic: CN 2-12 grossly intact. Sensation intact, DTR normal. Strength 5/5 in all 4.  Psychiatric: Normal judgment and insight. Alert and oriented x 3. Normal mood.    Labs on Admission: I have personally reviewed following labs and imaging studies  CBC: Recent Labs  Lab 07/17/18 1444 07/19/18 1422  WBC 9.8 8.8  NEUTROABS  --  6.6  HGB 14.0 13.3  HCT 44.5 42.7  MCV 89.4 87.5  PLT 211 627   Basic Metabolic Panel: Recent Labs  Lab 07/17/18 1444 07/19/18 1422  NA 134* 131*  K 4.2 6.1*  CL 99 97*  CO2 27 20*  GLUCOSE 108* 99  BUN 19 27*  CREATININE 1.41* 2.13*  CALCIUM 9.6 8.9   GFR: Estimated Creatinine Clearance: 31.2 mL/min (A) (by C-G formula based on SCr of 2.13 mg/dL (H)). Liver Function Tests: Recent Labs  Lab 07/17/18 1444  AST 22  ALT 20  ALKPHOS 67  BILITOT 0.8  PROT 7.7  ALBUMIN 4.4   Recent Labs  Lab 07/17/18 1444  LIPASE 43   Cardiac Enzymes: Recent Labs  Lab 07/19/18 1430  TROPONINI 0.05*   Urine analysis:    Component Value Date/Time   COLORURINE YELLOW 07/19/2018 1615   APPEARANCEUR CLEAR 07/19/2018 1615   LABSPEC 1.006 07/19/2018 1615   PHURINE 6.0 07/19/2018 1615   GLUCOSEU NEGATIVE 07/19/2018 1615   HGBUR SMALL (A) 07/19/2018 1615   BILIRUBINUR NEGATIVE 07/19/2018 1615   [place] 07/19/2018 1615   PROTEINUR 30 (A) 07/19/2018 1615   NITRITE NEGATIVE 07/19/2018 1615   LEUKOCYTESUR NEGATIVE 07/19/2018 1615    Radiological Exams on Admission: Ct Head Wo Contrast  Result Date: 07/19/2018 CLINICAL DATA:  Syncope and falls. EXAM: CT HEAD WITHOUT CONTRAST CT CERVICAL SPINE WITHOUT CONTRAST TECHNIQUE: Multidetector CT imaging of the head and cervical spine was performed following the standard protocol without intravenous contrast. Multiplanar CT image reconstructions of the cervical spine were also generated. COMPARISON:  None. FINDINGS: CT HEAD FINDINGS Brain: No evidence of acute infarction, hemorrhage,  hydrocephalus, extra-axial collection or mass lesion/mass effect. Vascular: No hyperdense vessel or unexpected calcification. Skull: Normal. Negative for fracture or focal lesion. Sinuses/Orbits: No acute finding. Other: None. CT CERVICAL SPINE FINDINGS Alignment: Normal. Skull base and vertebrae: No acute fracture. No primary bone lesion or focal pathologic process. Soft tissues and spinal canal: No prevertebral fluid or swelling. No visible canal hematoma. Disc levels: Severe degenerative disc disease is noted at C5-6 and C6-7 with anterior osteophyte formation. Upper chest: Negative. Other: Degenerative changes are seen involving posterior facet joints bilaterally. IMPRESSION: Normal head CT. Multilevel degenerative disc disease is noted in the cervical spine. No acute abnormality is seen. Electronically Signed   By: Marijo Conception, M.D.   On: 07/19/2018 15:05   Ct Cervical Spine Wo Contrast  Result Date: 07/19/2018 CLINICAL DATA:  Syncope and falls. EXAM: CT HEAD WITHOUT CONTRAST CT CERVICAL SPINE WITHOUT CONTRAST TECHNIQUE: Multidetector CT imaging of the head and cervical spine was performed following the standard protocol without intravenous contrast. Multiplanar CT image reconstructions of the cervical spine were also generated. COMPARISON:  None. FINDINGS: CT HEAD FINDINGS Brain: No evidence of acute infarction, hemorrhage, hydrocephalus, extra-axial collection or mass lesion/mass effect. Vascular: No hyperdense vessel or unexpected calcification. Skull: Normal. Negative for fracture or focal lesion. Sinuses/Orbits: No acute finding. Other: None. CT CERVICAL SPINE FINDINGS Alignment: Normal. Skull base and vertebrae: No acute fracture. No primary bone lesion or focal pathologic process. Soft tissues and spinal canal: No prevertebral fluid or swelling. No visible canal hematoma. Disc levels: Severe degenerative disc disease is noted at C5-6 and C6-7 with anterior osteophyte formation. Upper chest:  Negative. Other: Degenerative changes are seen involving posterior facet joints bilaterally. IMPRESSION: Normal head CT. Multilevel degenerative disc disease is noted in the cervical spine. No acute abnormality is seen. Electronically Signed   By: Marijo Conception, M.D.   On: 07/19/2018 15:05    EKG: Independently reviewed.  This rhythm with nonspecific ST-T wave abnormality no priors available for evaluation  Assessment/Plan Principal Problem:   AKI (acute kidney injury) ([place]) Active Problems:   Orthostatic hypotension   Hyperkalemia  Hyponatremia   Near syncope   Systolic CHF ([place])   1.  Acute kidney injury: Etiology is unclear however patient does have orthostatic hypotension appears to be dehydrated.  We will hydrate patient and recheck BMP in a.m.  2.  Orthostatic hypotension as above.  3.  Hyperkalemia: Likely related to acute kidney injury.  Patient received Lokelma, insulin, and dextrose in the emergency department will recheck BMP early this evening.  4.  Hyponatremia: Likely related to dehydration and volume will hydrate and recheck in a.m.  5.  Near syncope related to orthostasis as above.  6.  History of systolic congestive heart failure currently well compensated.  7.  Abdominal discomfort.  Etiology is unclear is resolved now we will monitor overnight and see if he has any more issues.  May benefit from GI consultation inpatient if returns.  DVT prophylaxis: Subcu heparin Code Status: Full code Family Communication: Spoke with patient's wife and patient's daughter Dr. Corinna Lines who is a pediatrician with our system. Disposition Plan: Likely home in 24 to 48 hours Consults called: None Admission status: Observation   Lady Deutscher MD FACP [facility] Pager [phone]  How to contact the [facility] Attending or Consulting provider [place] or covering provider during after hours [place], for this patient?  1. Check the care team in [facility] and look for a)  attending/consulting [facility] provider listed and b) the [facility] team listed 2. Log into [email or web address] and use [place]'s universal password to access. If you do not have the password, please contact the hospital operator. 3. Locate the [facility] provider you are looking for under [facility] and page to a number that you can be directly reached. 4. If you still have difficulty reaching the provider, please page the [facility] (Director on Call) for the Hospitalists listed on amion for assistance.  If 7PM-7AM, please contact night-coverage [email or web address] Password [facility]  07/19/2018, 7:39 PM

## 2018-07-19 NOTE — ED Notes (Signed)
ED TO INPATIENT HANDOFF REPORT  ED Nurse Name and Phone #:  Darrold Span [phone]  S Name/Age/Gender Abdulaziz R Schmelzle 73 y.o. male Room/Bed: 017C/017C  Code Status   Code Status: Full Code  Home/SNF/Other Home Patient oriented to: self, place, time and situation Is this baseline? Yes   Triage Complete: Triage complete  Chief Complaint sick  Triage Note Pt BIB [facility] from his PCP's office for increased weakness, multiple falls today and a near syncopal episode at his doctor's office today. Pt was hypotensive for EMS at [place] initially and then 99/63 after 300 of NSS. Pt denying any pain upon arrival to ED. Pt reports that he is taking several opioids for abdominal pain that he has been having for about four days and being treated for outpatient. Pt also has had a decreased PO intake for the past week.    Allergies Allergies  Allergen Reactions  . Penicillins     Level of Care/Admitting Diagnosis ED Disposition    ED Disposition Condition Comment   Admit  Hospital Area: [facility] [[id number]]  Level of Care: Telemetry Cardiac [103]  I expect the patient will be discharged within 24 hours: Yes  LOW acuity---Tx typically complete <24 hrs---ACUTE conditions typically can be evaluated <24 hours---LABS likely to return to acceptable levels <24 hours---IS near functional baseline---EXPECTED to return to current living arrangement---NOT newly hypoxic: Meets criteria for 5C-Observation unit  Diagnosis: Acute kidney injury [facility]) [[id number]]  Admitting Physician: Lady Deutscher [[id number]]  Attending Physician: Lady Deutscher [[id number]]  PT Class (Do Not Modify): Observation [104]  PT Acc Code (Do Not Modify): Observation [10022]       B Medical/Surgery History Past Medical History:  Diagnosis Date  . Anxiety   . CHF (congestive heart failure) (HCC)    EF 35%. WITH MEDICATION HIS EF IS NOW 55%  . Coronary artery disease    ruptured plaque in the LAD - started  Plavix at that point.  . Diverticulosis   . Enlarged prostate   . Hyperlipidemia   . PVC's (premature ventricular contractions)   . Renal cyst    Past Surgical History:  Procedure Laterality Date  . CARDIAC CATHETERIZATION  12/04/2003   EF 30-35%  . CARDIOVASCULAR STRESS TEST  08/20/2006   EF 50%  . HYDROCELE EXCISION / REPAIR    . NEPHRECTOMY    . TONSILLECTOMY    . US ECHOCARDIOGRAPHY  08/07/2005   EF 50-55%     A IV Location/Drains/Wounds Patient Lines/Drains/Airways Status   Active Line/Drains/Airways    Name:   Placement date:   Placement time:   Site:   Days:   Peripheral IV 07/19/18 Left Forearm   07/19/18    1459    Forearm   less than 1          Intake/Output Last 24 hours No intake or output data in the 24 hours ending 07/19/18 1651  Labs/Imaging Results for orders placed or performed during the hospital encounter of 07/19/18 (from the past 48 hour(s))  Basic metabolic panel     Status: Abnormal   Collection Time: 07/19/18  2:22 PM  Result Value Ref Range   Sodium 131 (L) 135 - 145 mmol/L   Potassium 6.1 (H) 3.5 - 5.1 mmol/L   Chloride 97 (L) 98 - 111 mmol/L   CO2 20 (L) 22 - 32 mmol/L   Glucose, Bld 99 70 - 99 mg/dL   BUN 27 (H) 8 - 23 mg/dL  Creatinine, Ser 2.13 (H) 0.61 - 1.24 mg/dL   Calcium 8.9 8.9 - 10.3 mg/dL   GFR calc non Af Amer 30 (L) >60 mL/min   GFR calc Af Amer 34 (L) >60 mL/min   Anion gap 14 5 - 15    Comment: Performed at [facility] [address], [place], [place] [address]  CBC with Differential     Status: None   Collection Time: 07/19/18  2:22 PM  Result Value Ref Range   WBC 8.8 4.0 - 10.5 K/uL   RBC 4.88 4.22 - 5.81 MIL/uL   Hemoglobin 13.3 13.0 - 17.0 g/dL   HCT 42.7 39.0 - 52.0 %   MCV 87.5 80.0 - 100.0 fL   MCH 27.3 26.0 - 34.0 pg   MCHC 31.1 30.0 - 36.0 g/dL   RDW 13.8 11.5 - 15.5 %   Platelets 187 150 - 400 K/uL   nRBC 0.0 0.0 - 0.2 %   Neutrophils Relative % 76 %   Neutro Abs 6.6 1.7 - 7.7 K/uL    Lymphocytes Relative 13 %   Lymphs Abs 1.2 0.7 - 4.0 K/uL   Monocytes Relative 9 %   Monocytes Absolute 0.8 0.1 - 1.0 K/uL   Eosinophils Relative 1 %   Eosinophils Absolute 0.1 0.0 - 0.5 K/uL   Basophils Relative 1 %   Basophils Absolute 0.1 0.0 - 0.1 K/uL   Immature Granulocytes 0 %   Abs Immature Granulocytes 0.03 0.00 - 0.07 K/uL    Comment: Performed at [facility], [address] [place], [place] [address]  Troponin I - ONCE - STAT     Status: Abnormal   Collection Time: 07/19/18  2:30 PM  Result Value Ref Range   Troponin I 0.05 (HH) <0.03 ng/mL    Comment: CRITICAL RESULT CALLED TO, READ BACK BY AND VERIFIED WITH: N.Abreanna Drawdy,RN 1522 07/19/2018 CLARK,S Performed at [facility] [address] [place], [place] [address]    Ct Abdomen Pelvis Wo Contrast  Result Date: 07/17/2018 CLINICAL DATA:  Acute onset of generalized abdominal pain and nausea. Constipation. EXAM: CT ABDOMEN AND PELVIS WITHOUT CONTRAST TECHNIQUE: Multidetector CT imaging of the abdomen and pelvis was performed following the standard protocol without IV contrast. COMPARISON:  None. FINDINGS: Lower chest: Minimal bibasilar scarring is noted. The visualized lung apices are otherwise clear. Hepatobiliary: The liver is unremarkable in appearance. The gallbladder is unremarkable in appearance. The common bile duct remains normal in caliber. Pancreas: The pancreas is within normal limits. Spleen: The spleen is unremarkable in appearance. Adrenals/Urinary Tract: The adrenal glands are unremarkable. Mild right-sided perinephric stranding is noted. A small right renal cyst is noted. The right kidney is otherwise unremarkable. There is no evidence of hydronephrosis. The patient is status post left-sided nephrectomy. No renal or ureteral stones are identified. Stomach/Bowel: The stomach is unremarkable in appearance. The small bowel is within normal limits. The appendix is normal in caliber, without evidence of  appendicitis. Scattered diverticulosis is noted along the descending and sigmoid colon, without evidence of diverticulitis. A small amount of stool is noted in the colon. Vascular/Lymphatic: Scattered calcification is seen along the abdominal aorta and its branches. The abdominal aorta is otherwise grossly unremarkable. The inferior vena cava is grossly unremarkable. No retroperitoneal lymphadenopathy is seen. No pelvic sidewall lymphadenopathy is identified. Reproductive: The bladder is mildly distended. There is impression on the base of the bladder by the prostate. The prostate is enlarged, measuring 5.6 cm in  transverse dimension. Other: No additional soft tissue abnormalities are seen. Musculoskeletal: No acute osseous abnormalities are identified. The visualized musculature is unremarkable in appearance. IMPRESSION: 1. No acute abnormality seen to explain the patient's symptoms. Small amount of stool noted in the colon. 2. Scattered diverticulosis along the descending and sigmoid colon, without evidence of diverticulitis. 3. Small right renal cyst noted. 4. Enlarged prostate, with impression on the base of the bladder by the prostate. Would correlate with PSA. Aortic Atherosclerosis (ICD10-I70.0). Electronically Signed   By: Garald Balding M.D.   On: 07/17/2018 17:52   Ct Head Wo Contrast  Result Date: 07/19/2018 CLINICAL DATA:  Syncope and falls. EXAM: CT HEAD WITHOUT CONTRAST CT CERVICAL SPINE WITHOUT CONTRAST TECHNIQUE: Multidetector CT imaging of the head and cervical spine was performed following the standard protocol without intravenous contrast. Multiplanar CT image reconstructions of the cervical spine were also generated. COMPARISON:  None. FINDINGS: CT HEAD FINDINGS Brain: No evidence of acute infarction, hemorrhage, hydrocephalus, extra-axial collection or mass lesion/mass effect. Vascular: No hyperdense vessel or unexpected calcification. Skull: Normal. Negative for fracture or focal lesion.  Sinuses/Orbits: No acute finding. Other: None. CT CERVICAL SPINE FINDINGS Alignment: Normal. Skull base and vertebrae: No acute fracture. No primary bone lesion or focal pathologic process. Soft tissues and spinal canal: No prevertebral fluid or swelling. No visible canal hematoma. Disc levels: Severe degenerative disc disease is noted at C5-6 and C6-7 with anterior osteophyte formation. Upper chest: Negative. Other: Degenerative changes are seen involving posterior facet joints bilaterally. IMPRESSION: Normal head CT. Multilevel degenerative disc disease is noted in the cervical spine. No acute abnormality is seen. Electronically Signed   By: Marijo Conception, M.D.   On: 07/19/2018 15:05   Ct Cervical Spine Wo Contrast  Result Date: 07/19/2018 CLINICAL DATA:  Syncope and falls. EXAM: CT HEAD WITHOUT CONTRAST CT CERVICAL SPINE WITHOUT CONTRAST TECHNIQUE: Multidetector CT imaging of the head and cervical spine was performed following the standard protocol without intravenous contrast. Multiplanar CT image reconstructions of the cervical spine were also generated. COMPARISON:  None. FINDINGS: CT HEAD FINDINGS Brain: No evidence of acute infarction, hemorrhage, hydrocephalus, extra-axial collection or mass lesion/mass effect. Vascular: No hyperdense vessel or unexpected calcification. Skull: Normal. Negative for fracture or focal lesion. Sinuses/Orbits: No acute finding. Other: None. CT CERVICAL SPINE FINDINGS Alignment: Normal. Skull base and vertebrae: No acute fracture. No primary bone lesion or focal pathologic process. Soft tissues and spinal canal: No prevertebral fluid or swelling. No visible canal hematoma. Disc levels: Severe degenerative disc disease is noted at C5-6 and C6-7 with anterior osteophyte formation. Upper chest: Negative. Other: Degenerative changes are seen involving posterior facet joints bilaterally. IMPRESSION: Normal head CT. Multilevel degenerative disc disease is noted in the cervical  spine. No acute abnormality is seen. Electronically Signed   By: Marijo Conception, M.D.   On: 07/19/2018 15:05    Pending Labs Unresulted Labs (From admission, onward)    Start     Ordered   07/20/18 7628  Basic metabolic panel  Tomorrow morning,   R     07/19/18 1640   07/19/18 1637  CBC  (heparin)  Once,   R    Comments:  Baseline for heparin therapy IF NOT ALREADY DRAWN.  Notify MD if PLT < 100 K.    07/19/18 1640   07/19/18 1637  Creatinine, serum  (heparin)  Once,   R    Comments:  Baseline for heparin therapy IF NOT ALREADY DRAWN.  07/19/18 1640   07/19/18 1520  H. pylori antibody, IgG  Once,   STAT     07/19/18 1520   07/19/18 1431  Urinalysis, Routine w reflex microscopic  ONCE - STAT,   R     07/19/18 1430          Vitals/Pain Today's Vitals   07/19/18 1400 07/19/18 1415 07/19/18 1545 07/19/18 1600  BP: 113/60 (!) 101/53 (!) 150/80 (!) 147/70  Pulse: 60 70 73 72  Resp: (!) 29 17 (!) 21 (!) 25  Temp:  97.7 F (36.5 C)    TempSrc:  Oral    SpO2: 94% 95% 99% 100%  Weight:      Height:      PainSc:        Isolation Precautions No active isolations  Medications Medications  sodium zirconium cyclosilicate (LOKELMA) packet 5 g (has no administration in time range)  heparin injection 5,000 Units (has no administration in time range)  sodium chloride flush (NS) 0.9 % injection 3 mL (has no administration in time range)  0.9 %  sodium chloride infusion (has no administration in time range)  acetaminophen (TYLENOL) tablet 650 mg (has no administration in time range)    Or  acetaminophen (TYLENOL) suppository 650 mg (has no administration in time range)  traMADol (ULTRAM) tablet 50 mg (has no administration in time range)  polyethylene glycol (MIRALAX / GLYCOLAX) packet 17 g (has no administration in time range)  ondansetron (ZOFRAN) tablet 4 mg (has no administration in time range)    Or  ondansetron (ZOFRAN) injection 4 mg (has no administration in time range)   dicyclomine (BENTYL) capsule 10 mg (has no administration in time range)  sodium chloride 0.9 % bolus 1,000 mL (1,000 mLs Intravenous New Bag/Given 07/19/18 1509)  insulin aspart (novoLOG) injection 5 Units (5 Units Intravenous Given 07/19/18 1607)  dextrose 50 % solution 50 mL (50 mLs Intravenous Given 07/19/18 1608)    Mobility walks High fall risk   Focused Assessments Cardiac Assessment Handoff:  Cardiac Rhythm: Normal sinus rhythm Lab Results  Component Value Date   TROPONINI 0.05 (HH) 07/19/2018   No results found for: DDIMER Does the Patient currently have chest pain? No     R Recommendations: See Admitting Provider Note  Report given to:   Additional Notes:

## 2018-07-19 NOTE — ED Triage Notes (Signed)
Pt BIB [facility] from his PCP's office for increased weakness, multiple falls today and a near syncopal episode at his doctor's office today. Pt was hypotensive for EMS at [place] initially and then 99/63 after 300 of NSS. Pt denying any pain upon arrival to ED. Pt reports that he is taking several opioids for abdominal pain that he has been having for about four days and being treated for outpatient. Pt also has had a decreased PO intake for the past week.

## 2018-07-19 NOTE — ED Notes (Signed)
This RN spoke with lab and they stated they would add on the Troponin I.

## 2018-07-20 ENCOUNTER — Observation Stay (HOSPITAL_COMMUNITY): Payer: Medicare HMO

## 2018-07-20 DIAGNOSIS — M5136 Other intervertebral disc degeneration, lumbar region: Secondary | ICD-10-CM | POA: Diagnosis not present

## 2018-07-20 DIAGNOSIS — R109 Unspecified abdominal pain: Secondary | ICD-10-CM | POA: Diagnosis present

## 2018-07-20 DIAGNOSIS — M47816 Spondylosis without myelopathy or radiculopathy, lumbar region: Secondary | ICD-10-CM | POA: Diagnosis not present

## 2018-07-20 DIAGNOSIS — I951 Orthostatic hypotension: Secondary | ICD-10-CM

## 2018-07-20 LAB — TROPONIN I: Troponin I: 0.06 ng/mL (ref <0.03)

## 2018-07-20 LAB — BASIC METABOLIC PANEL
Anion gap: 10 (ref 5–15)
BUN: 27 mg/dL — ABNORMAL HIGH (ref 8–23)
CO2: 24 mmol/L (ref 22–32)
Calcium: 8.8 mg/dL — ABNORMAL LOW (ref 8.9–10.3)
Chloride: 101 mmol/L (ref 98–111)
Creatinine, Ser: 1.9 mg/dL — ABNORMAL HIGH (ref 0.61–1.24)
GFR calc Af Amer: 39 mL/min — ABNORMAL LOW (ref >60)
GFR calc non Af Amer: 34 mL/min — ABNORMAL LOW (ref >60)
Glucose, Bld: 134 mg/dL — ABNORMAL HIGH (ref 70–99)
Potassium: 3.9 mmol/L (ref 3.5–5.1)
Sodium: 135 mmol/L (ref 135–145)

## 2018-07-20 MED ORDER — MORPHINE SULFATE (PF) 2 MG/ML IV SOLN
1.0000 mg | INTRAVENOUS | Status: DC | PRN
  Administered 2018-07-20 (×3): 1 mg via INTRAVENOUS
  Filled 2018-07-20 (×3): qty 1

## 2018-07-20 MED ORDER — SODIUM CHLORIDE 0.9 % IV SOLN: INTRAVENOUS | Status: DC

## 2018-07-20 MED ORDER — PEG 3350-KCL-NA BICARB-NACL 420 G PO SOLR
4000.0000 mL | Freq: Once | ORAL | Status: AC
  Administered 2018-07-20: 4000 mL via ORAL
  Filled 2018-07-20: qty 4000

## 2018-07-20 MED ORDER — DOCUSATE SODIUM 100 MG PO CAPS
100.0000 mg | ORAL_CAPSULE | Freq: Every day | ORAL | Status: DC
  Administered 2018-07-20: 100 mg via ORAL
  Filled 2018-07-20: qty 1

## 2018-07-20 MED ORDER — CIPROFLOXACIN IN D5W 400 MG/200ML IV SOLN
400.0000 mg | INTRAVENOUS | Status: DC
  Administered 2018-07-20 – 2018-07-21 (×2): 400 mg via INTRAVENOUS
  Filled 2018-07-20 (×2): qty 200

## 2018-07-20 MED ORDER — TAMSULOSIN HCL 0.4 MG PO CAPS
0.4000 mg | ORAL_CAPSULE | Freq: Every day | ORAL | Status: DC
  Administered 2018-07-20 – 2018-07-22 (×3): 0.4 mg via ORAL
  Filled 2018-07-20 (×3): qty 1

## 2018-07-20 MED ORDER — METHOCARBAMOL 500 MG PO TABS
500.0000 mg | ORAL_TABLET | Freq: Four times a day (QID) | ORAL | Status: DC | PRN
  Administered 2018-07-20: 500 mg via ORAL
  Filled 2018-07-20: qty 1

## 2018-07-20 MED ORDER — CARVEDILOL 25 MG PO TABS
25.0000 mg | ORAL_TABLET | Freq: Two times a day (BID) | ORAL | Status: DC
  Administered 2018-07-20 – 2018-07-22 (×4): 25 mg via ORAL
  Filled 2018-07-20 (×4): qty 1

## 2018-07-20 NOTE — Progress Notes (Signed)
Pt states he has fallen in the last 6 months, pt states it was because of the medications and "they are out of my system now" Pt refusing bed alarm  Educated pt, pt still refusing  Yellow arm band on, yellow socks on, bed in lowest position  Will continue to monitor

## 2018-07-20 NOTE — Care Management Obs Status (Signed)
[place] NOTIFICATION   Patient Details  Name: Maurice Barron [facility] MRN: [id number] Date of Birth: [birth year]   Medicare Observation Status Notification Given:  Yes    Oretha Milch, LCSW 07/20/2018, 4:40 PM

## 2018-07-20 NOTE — Progress Notes (Signed)
Educated pt and pt family on new medication- robaxin, provided hand out

## 2018-07-20 NOTE — Progress Notes (Signed)
[facility] PROGRESS NOTE  [facility] MBW:[id number] DOB: 04/17/1946 DOA: 07/19/2018 PCP: [name], MD  Assessment/Plan:   1.  Acute kidney injury: likely related to dehydration secondary to decreased po intake and 3 new meds (bentyl, tramadol and flexaril) in setting of having only one kidney. Improved this am. Creatinine trending downward.  -continue gentle IV fluids -hold nephrotoxins -recheck in am  2.  Orthostatic hypotension. Resolved this am.  as above.  3.  Hyperkalemia: Likely related to acute kidney injury.  Patient received Lokelma, insulin, and dextrose in the emergency department. Resolved this am.  -recheck in am  4.  Hyponatremia: Likely related to dehydration and volume. Resolved this am.  -continue gentle IV fluids.  -bmet in am  5.  Near syncope related to orthostasis as above.  6.  History of chronic systolic congestive heart failure. Remains well compensated. Chart review indicates cards visit 2017 where it was noted that initial EF 35% but with good medical therapy EF now 55%.  -continue BB -daily weights -monitor intake and output  7.  Abdominal discomfort.  Etiology is unclear. Pain has migrated to lumbar spine and bilateral thighs. No complaints nmbness/tingling. abd distended with good BS. CT reveals enlarged prostate. Query prostatitis.  -cipro -gi consult -abdominal US in am -supportive therapy -slow bowel prep per gi -clear liquids per gi   Code Status: full Family Communication: none present Disposition Plan: home hopefully tomorrow   Consultants:  [facility]ology  Procedures:    Antibiotics:  cipro 07/20/18>>  HPI/Subjective: Maurice Barron is a 73 y.o. male with medical history significant of systolic congestive heart failure, coronary artery disease, 1 kidney, enlarged prostate, diverticulosis, hyperlipidemia who presented to ED on 07/19/18 with cc increased weakness, multiple falls and a near  syncopal episode at the doctor's office.  EMS arrived he was hypotensive with a blood pressure of 68/52.  It went up to 99/60 after IV fluids. Patient denied any pain and stated that he had been taking medication due to abdominal pain he has had for 4 days.  Had a poor p.o. intake during this time.  He was seen at the emergency department on 11 March feeling like he needed to have a bowel movement.  Had problems with constipation for 2 days prior to presentation.  A CT scan was obtained and was markable only for scattered diverticulosis but no evidence of reticulitis.  He did have an enlarged prostate.  Patient was prescribed Bentyl for his abdominal discomfort went to see his PCP again the next day due to continued pain was given 50 mg of tramadol and 5 mg of Flexeril.  He took Bentyl Flexeril and tramadol the night before presenation around 8 PM felt well and his pain had improved when he woke up  however he felt lightheaded and dizzy.  Stated he was stumbling about.  Went to the PCPs office had an episode of pre syncope. Workup revealed aki, hyperkalemia, hyponatremia.  Objective: Vitals:   07/20/18 0855 07/20/18 1210  BP:  (!) 195/99  Pulse: 94 97  Resp:  18  Temp:  98.3 F (36.8 C)  SpO2:  96%    Intake/Output Summary (Last 24 hours) at 07/20/2018 1341 Last data filed at 07/20/2018 1117 Gross per 24 hour  Intake 2095.9 ml  Output 150 ml  Net 1945.9 ml   Filed Weights   07/19/18 1329 07/19/18 1800  Weight: 81.2 kg 81.8 kg    Exam:   General:  Sitting in chair  in no acute distress  Cardiovascular: rrr no mbr no LE edema  Respiratory: normal effort BS clear but distant no crackles or wheezes  Abdomen: slightly distended, +BS no guarding or rebounding   Musculoskeletal: joints without swelling/erythema   Data Reviewed: Basic Metabolic Panel: Recent Labs  Lab 07/17/18 1444 07/19/18 1422 07/19/18 2004 07/20/18 0535  NA 134* 131* 135 135  K 4.2 6.1* 4.0 3.9  CL 99 97* 102  101  CO2 27 20* 24 24  GLUCOSE 108* 99 89 134*  BUN 19 27* 25* 27*  CREATININE 1.41* 2.13* 2.17* 1.90*  CALCIUM 9.6 8.9 8.8* 8.8*   Liver Function Tests: Recent Labs  Lab 07/17/18 1444  AST 22  ALT 20  ALKPHOS 67  BILITOT 0.8  PROT 7.7  ALBUMIN 4.4   Recent Labs  Lab 07/17/18 1444  LIPASE 43   No results for input(s): AMMONIA in the last 168 hours. CBC: Recent Labs  Lab 07/17/18 1444 07/19/18 1422  WBC 9.8 8.8  NEUTROABS  --  6.6  HGB 14.0 13.3  HCT 44.5 42.7  MCV 89.4 87.5  PLT 211 187   Cardiac Enzymes: Recent Labs  Lab 07/19/18 1430 07/20/18 1156  TROPONINI 0.05* 0.06*   BNP (last 3 results) No results for input(s): BNP in the last 8760 hours.  ProBNP (last 3 results) No results for input(s): PROBNP in the last 8760 hours.  CBG: No results for input(s): GLUCAP in the last 168 hours.  No results found for this or any previous visit (from the past 240 hour(s)).   Studies: Dg Lumbar Spine 2-3 Views  Result Date: 07/20/2018 CLINICAL DATA:  Frequent falls.  Pain. EXAM: LUMBAR SPINE - 2-3 VIEW COMPARISON:  March 19, 2019 FINDINGS: Contrast in the colon overlies the upper lumbar spine limiting evaluation on the frontal view. Anterior wedging of T11 is stable since July 17, 2018. No acute fractures in the lumbar spine. No malalignment. Multilevel degenerative disc disease and lower lumbar facet degenerative changes. IMPRESSION: Degenerative changes. No acute fractures or malalignment identified. Electronically Signed   By: Dorise Bullion III M.D   On: 07/20/2018 13:37   Ct Head Wo Contrast  Result Date: 07/19/2018 CLINICAL DATA:  Syncope and falls. EXAM: CT HEAD WITHOUT CONTRAST CT CERVICAL SPINE WITHOUT CONTRAST TECHNIQUE: Multidetector CT imaging of the head and cervical spine was performed following the standard protocol without intravenous contrast. Multiplanar CT image reconstructions of the cervical spine were also generated. COMPARISON:  None.  FINDINGS: CT HEAD FINDINGS Brain: No evidence of acute infarction, hemorrhage, hydrocephalus, extra-axial collection or mass lesion/mass effect. Vascular: No hyperdense vessel or unexpected calcification. Skull: Normal. Negative for fracture or focal lesion. Sinuses/Orbits: No acute finding. Other: None. CT CERVICAL SPINE FINDINGS Alignment: Normal. Skull base and vertebrae: No acute fracture. No primary bone lesion or focal pathologic process. Soft tissues and spinal canal: No prevertebral fluid or swelling. No visible canal hematoma. Disc levels: Severe degenerative disc disease is noted at C5-6 and C6-7 with anterior osteophyte formation. Upper chest: Negative. Other: Degenerative changes are seen involving posterior facet joints bilaterally. IMPRESSION: Normal head CT. Multilevel degenerative disc disease is noted in the cervical spine. No acute abnormality is seen. Electronically Signed   By: Marijo Conception, M.D.   On: 07/19/2018 15:05   Ct Cervical Spine Wo Contrast  Result Date: 07/19/2018 CLINICAL DATA:  Syncope and falls. EXAM: CT HEAD WITHOUT CONTRAST CT CERVICAL SPINE WITHOUT CONTRAST TECHNIQUE: Multidetector CT imaging of the head and cervical  spine was performed following the standard protocol without intravenous contrast. Multiplanar CT image reconstructions of the cervical spine were also generated. COMPARISON:  None. FINDINGS: CT HEAD FINDINGS Brain: No evidence of acute infarction, hemorrhage, hydrocephalus, extra-axial collection or mass lesion/mass effect. Vascular: No hyperdense vessel or unexpected calcification. Skull: Normal. Negative for fracture or focal lesion. Sinuses/Orbits: No acute finding. Other: None. CT CERVICAL SPINE FINDINGS Alignment: Normal. Skull base and vertebrae: No acute fracture. No primary bone lesion or focal pathologic process. Soft tissues and spinal canal: No prevertebral fluid or swelling. No visible canal hematoma. Disc levels: Severe degenerative disc disease  is noted at C5-6 and C6-7 with anterior osteophyte formation. Upper chest: Negative. Other: Degenerative changes are seen involving posterior facet joints bilaterally. IMPRESSION: Normal head CT. Multilevel degenerative disc disease is noted in the cervical spine. No acute abnormality is seen. Electronically Signed   By: Marijo Conception, M.D.   On: 07/19/2018 15:05    Scheduled Meds: . docusate sodium  100 mg Oral Daily  . heparin  5,000 Units Subcutaneous Q8H  . sodium chloride flush  3 mL Intravenous Q12H  . tamsulosin  0.4 mg Oral QPC supper   Continuous Infusions: . sodium chloride    . ciprofloxacin 400 mg (07/20/18 1224)    Principal Problem:   AKI (acute kidney injury) ([place]) Active Problems:   Orthostatic hypotension   Hyperkalemia   Near syncope   Abdominal pain   Systolic CHF ([place])   Hyponatremia    Time spent: 45 minutes    [place] NP  [facility]  If 7PM-7AM, please contact night-coverage at [email or web address], password [facility] 07/20/2018, 1:41 PM  LOS: 0 days

## 2018-07-20 NOTE — Consult Note (Signed)
[facility] Consultation Note  Referring Provider:  [facility] Primary Care Physician:  [name], MD  Reason for Consultation:  Abdominal pain  HPI: Maurice Barron is a 73 y.o. male whom I've been asked to evaluate for abdominal pain. Patient in static state of GI health until about 2 weeks ago.  At that time, he had some upper band like abdominal pain with nausea and poor appetite.  Saw PCP, given pantoprazole without clear benefit; had exacerbation of pain, migration of pain to lower abdominal area and syncopal event.  CT unrevealing except for enlarged prostate.  No blood in stool.  Is newly constipated, no bowel movement for the past few days, which is not like him.  Had been given some narcotics at ED for his abdominal pain.  Last colonoscopy 2015 by Dr. Amedeo Plenty.  Had endoscopy for esophageal food impaction with Dr. Penelope Coop in 2012.   Past Medical History:  Diagnosis Date  . Anxiety   . CHF (congestive heart failure) (HCC)    EF 35%. WITH MEDICATION HIS EF IS NOW 55%  . Coronary artery disease    ruptured plaque in the LAD - started Plavix at that point.  . Diverticulosis   . Enlarged prostate   . Hyperlipidemia   . PVC's (premature ventricular contractions)   . Renal cyst     Past Surgical History:  Procedure Laterality Date  . CARDIAC CATHETERIZATION  12/04/2003   EF 30-35%  . CARDIOVASCULAR STRESS TEST  08/20/2006   EF 50%  . HYDROCELE EXCISION / REPAIR    . NEPHRECTOMY    . TONSILLECTOMY    . US ECHOCARDIOGRAPHY  08/07/2005   EF 50-55%    Prior to Admission medications   Medication Sig Start Date End Date Taking? Authorizing Provider  acetaminophen (TYLENOL) 325 MG tablet Take 650 mg by mouth every 6 (six) hours as needed for headache (pain).   Yes [provider]  aspirin EC 81 MG tablet Take 81 mg by mouth at bedtime.   Yes [provider]  carvedilol (COREG) 25 MG tablet Take 25 mg by mouth 2 (two) times daily with a meal.      Yes [provider]  Cholecalciferol (VITAMIN D3 PO) Take 1 tablet by mouth at bedtime.   Yes [provider]  clopidogrel (PLAVIX) 75 MG tablet Take 75 mg by mouth daily.     Yes [provider]  cyclobenzaprine (FLEXERIL) 5 MG tablet Take 5 mg by mouth every 8 (eight) hours as needed for muscle spasms.   Yes [provider]  dicyclomine (BENTYL) 20 MG tablet Take 1 tablet (20 mg total) by mouth 2 (two) times daily. 07/17/18  Yes Leaphart, Chrissie Noa T, PA-C  fish oil-omega-3 fatty acids 1000 MG capsule Take 1 g by mouth at bedtime.    Yes [provider]  ipratropium (ATROVENT) 0.03 % nasal spray Place 2 sprays into both nostrils 3 (three) times daily as needed (runny nose).    Yes [provider]  irbesartan (AVAPRO) 75 MG tablet Take 75 mg by mouth daily.   Yes [provider]  loratadine (CLARITIN) 10 MG tablet Take 10 mg by mouth daily as needed (seasonal allergies).   Yes [provider]  Minoxidil (ROGAINE EX) Apply 1 application topically daily. Shampoo with Rogaine daily   Yes [provider]  ondansetron (ZOFRAN) 8 MG tablet Take 8 mg by mouth every 6 (six) hours as needed for nausea or vomiting.  07/18/18  Yes [provider]  pantoprazole (PROTONIX) 40 MG tablet Take 40 mg by mouth 2 (two) times daily. 07/16/18  Yes [provider]  [place] into the lungs at bedtime. CPAP   Yes [provider]  rosuvastatin (CRESTOR) 40 MG tablet Take 40 mg by mouth at bedtime.    Yes [provider]  traMADol (ULTRAM) 50 MG tablet Take 50 mg by mouth every 8 (eight) hours as needed (pain).  07/18/18  Yes [provider]    Current Facility-Administered Medications  Medication Dose Route Frequency Provider Last Rate Last Dose  . 0.9 %  sodium chloride infusion   Intravenous Continuous Black, Lezlie Octave, NP      . acetaminophen (TYLENOL) tablet 650 mg  650 mg Oral  Q6H PRN Lady Deutscher, MD       Or  . acetaminophen (TYLENOL) suppository 650 mg  650 mg Rectal Q6H PRN Lady Deutscher, MD      . ciprofloxacin (CIPRO) IVPB 400 mg  400 mg Intravenous Q24H Vann, Jessica U, DO 200 mL/hr at 07/20/18 1224 400 mg at 07/20/18 1224  . docusate sodium (COLACE) capsule 100 mg  100 mg Oral Daily Eulogio Bear U, DO   100 mg at 07/20/18 1222  . heparin injection 5,000 Units  5,000 Units Subcutaneous Q8H Lady Deutscher, MD   5,000 Units at 07/20/18 1222  . methocarbamol (ROBAXIN) tablet 500 mg  500 mg Oral Q6H PRN Radene Gunning, NP   500 mg at 07/20/18 1003  . morphine 2 MG/ML injection 1 mg  1 mg Intravenous Q4H PRN Eulogio Bear U, DO   1 mg at 07/20/18 1219  . ondansetron (ZOFRAN) tablet 4 mg  4 mg Oral Q6H PRN Lady Deutscher, MD       Or  . ondansetron [facility]) injection 4 mg  4 mg Intravenous Q6H PRN Lady Deutscher, MD      . polyethylene glycol (MIRALAX / GLYCOLAX) packet 17 g  17 g Oral Daily PRN Lady Deutscher, MD      . sodium chloride flush (NS) 0.9 % injection 3 mL  3 mL Intravenous Q12H Lady Deutscher, MD   3 mL at 07/19/18 2224  . tamsulosin (FLOMAX) capsule 0.4 mg  0.4 mg Oral QPC supper Vann, Jessica U, DO      . traMADol (ULTRAM) tablet 50 mg  50 mg Oral Q6H PRN Lady Deutscher, MD   50 mg at 07/20/18 0205    Allergies as of 07/19/2018 - Review Complete 07/19/2018  Allergen Reaction Noted  . Penicillins Other (See Comments) 12/01/2010    Family History  Problem Relation Age of Onset  . Heart disease Mother   . Sarcoidosis Mother   . Heart attack Father   . Heart attack Brother   . Coronary artery disease Brother     Social History   Socioeconomic History  . Marital status: Married    Spouse name: Not on file  . Number of children: Not on file  . Years of education: Not on file  . Highest education level: Not on file  Occupational History  . Not on file  Social Needs  . Financial resource strain: Not on  file  . Food insecurity:    Worry: Not on file    Inability: Not on file  . Transportation needs:    Medical: Not on file    Non-medical: Not on file  Tobacco Use  .  Smoking status: Former Smoker    Last attempt to quit: 05/08/1992    Years since quitting: 26.2  . Smokeless tobacco: Never Used  Substance and Sexual Activity  . Alcohol use: Yes    Comment: occ  . Drug use: No  . Sexual activity: Not on file  Lifestyle  . Physical activity:    Days per week: Not on file    Minutes per session: Not on file  . Stress: Not on file  Relationships  . Social connections:    Talks on phone: Not on file    Gets together: Not on file    Attends religious service: Not on file    Active member of club or organization: Not on file    Attends meetings of clubs or organizations: Not on file    Relationship status: Not on file  . Intimate partner violence:    Fear of current or ex partner: Not on file    Emotionally abused: Not on file    Physically abused: Not on file    Forced sexual activity: Not on file  Other Topics Concern  . Not on file  Social History Narrative  . Not on file    Review of Systems: As per HPI, all others negative  Physical Exam: Vital signs in last 24 hours: Temp:  [97.7 F (36.5 C)-98.6 F (37 C)] 98.3 F (36.8 C) (03/14 1210) Pulse Rate:  [60-97] 97 (03/14 1210) Resp:  [13-29] 18 (03/14 1210) BP: (101-195)/(53-99) 195/99 (03/14 1210) SpO2:  [94 %-100 %] 96 % (03/14 1210) Weight:  [81.2 kg-81.8 kg] 81.8 kg (03/13 1800) Last BM Date: 07/16/18 General:   Alert,  Well-developed, well-nourished, pleasant and cooperative in NAD Head:  Normocephalic and atraumatic. Eyes:  Sclera clear, no icterus.   Conjunctiva pink. Ears:  Normal auditory acuity. Nose:  No deformity, discharge,  or lesions. Mouth:  No deformity or lesions.  Oropharynx pink & moist. Neck:  Supple; no masses or thyromegaly. Lungs:  Clear throughout to auscultation.   No wheezes, crackles,  or rhonchi. No acute distress. Heart:  Regular rate and rhythm; no murmurs, clicks, rubs,  or gallops. Abdomen:  Soft, protuberant, dullness lower abdomen most consistent with constipation; No masses, hepatosplenomegaly or hernias noted. Normal bowel sounds, without guarding, and without rebound.     Msk:  Symmetrical without gross deformities. Normal posture. Pulses:  Normal pulses noted. Extremities:  Without clubbing or edema. Neurologic:  Alert and  oriented x4;  grossly normal neurologically. Skin:  Intact without significant lesions or rashes. Psych:  Alert and cooperative. Normal mood and affect.   Lab Results: Recent Labs    07/17/18 1444 07/19/18 1422  WBC 9.8 8.8  HGB 14.0 13.3  HCT 44.5 42.7  PLT 211 187   BMET Recent Labs    07/19/18 1422 07/19/18 2004 07/20/18 0535  NA 131* 135 135  K 6.1* 4.0 3.9  CL 97* 102 101  CO2 20* 24 24  GLUCOSE 99 89 134*  BUN 27* 25* 27*  CREATININE 2.13* 2.17* 1.90*  CALCIUM 8.9 8.8* 8.8*   LFT Recent Labs    07/17/18 1444  PROT 7.7  ALBUMIN 4.4  AST 22  ALT 20  ALKPHOS 67  BILITOT 0.8   PT/INR No results for input(s): LABPROT, INR in the last 72 hours.  Studies/Results: Ct Head Wo Contrast  Result Date: 07/19/2018 CLINICAL DATA:  Syncope and falls. EXAM: CT HEAD WITHOUT CONTRAST CT CERVICAL SPINE WITHOUT CONTRAST TECHNIQUE: Multidetector CT  imaging of the head and cervical spine was performed following the standard protocol without intravenous contrast. Multiplanar CT image reconstructions of the cervical spine were also generated. COMPARISON:  None. FINDINGS: CT HEAD FINDINGS Brain: No evidence of acute infarction, hemorrhage, hydrocephalus, extra-axial collection or mass lesion/mass effect. Vascular: No hyperdense vessel or unexpected calcification. Skull: Normal. Negative for fracture or focal lesion. Sinuses/Orbits: No acute finding. Other: None. CT CERVICAL SPINE FINDINGS Alignment: Normal. Skull base and vertebrae:  No acute fracture. No primary bone lesion or focal pathologic process. Soft tissues and spinal canal: No prevertebral fluid or swelling. No visible canal hematoma. Disc levels: Severe degenerative disc disease is noted at C5-6 and C6-7 with anterior osteophyte formation. Upper chest: Negative. Other: Degenerative changes are seen involving posterior facet joints bilaterally. IMPRESSION: Normal head CT. Multilevel degenerative disc disease is noted in the cervical spine. No acute abnormality is seen. Electronically Signed   By: Marijo Conception, M.D.   On: 07/19/2018 15:05   Ct Cervical Spine Wo Contrast  Result Date: 07/19/2018 CLINICAL DATA:  Syncope and falls. EXAM: CT HEAD WITHOUT CONTRAST CT CERVICAL SPINE WITHOUT CONTRAST TECHNIQUE: Multidetector CT imaging of the head and cervical spine was performed following the standard protocol without intravenous contrast. Multiplanar CT image reconstructions of the cervical spine were also generated. COMPARISON:  None. FINDINGS: CT HEAD FINDINGS Brain: No evidence of acute infarction, hemorrhage, hydrocephalus, extra-axial collection or mass lesion/mass effect. Vascular: No hyperdense vessel or unexpected calcification. Skull: Normal. Negative for fracture or focal lesion. Sinuses/Orbits: No acute finding. Other: None. CT CERVICAL SPINE FINDINGS Alignment: Normal. Skull base and vertebrae: No acute fracture. No primary bone lesion or focal pathologic process. Soft tissues and spinal canal: No prevertebral fluid or swelling. No visible canal hematoma. Disc levels: Severe degenerative disc disease is noted at C5-6 and C6-7 with anterior osteophyte formation. Upper chest: Negative. Other: Degenerative changes are seen involving posterior facet joints bilaterally. IMPRESSION: Normal head CT. Multilevel degenerative disc disease is noted in the cervical spine. No acute abnormality is seen. Electronically Signed   By: Marijo Conception, M.D.   On: 07/19/2018 15:05     Impression:  1.  Lower (previously upper) abdominal pain.  Migratory.  Possibly constipation. 2.  Nausea and malaise.  Unclear etiology.  Could be from constipation. 3.  Syncope and weakness.  Unclear etiology.  Vasovagal? 4.  Enlarged prostate on CT scan.  Plan:  1.  Slow bowel prep (Golytely, one gallon, consume slowly over next approximately 10-12 hours), as fecal purgative. 2.  Deescalate diet to clear liquids. 3.  Abdominal ultrasound complete in AM. 4.  [facility] will revisit tomorrow.   LOS: 0 days   Tanvir Hipple M  07/20/2018, 1:24 PM  Cell [phone] If no answer or after 5 PM call [phone]

## 2018-07-20 NOTE — Progress Notes (Signed)
Educated pt on strict intake and output, provided urinal at bedside

## 2018-07-20 NOTE — Progress Notes (Addendum)
Pt daughter came to front desk, states pt has not had a BM in over four days. RN stated there is a PRN miralax, pt daughter refused stated she wants a KUB completed first. RN explained there was no order at this time. Pt daughter also requesting GI consult while in hospital.   Pt pain in abdomen and down the hips, pt was in ED on 3/11 for similar symptoms and received CT   Will inform MD and continue to monitor

## 2018-07-21 ENCOUNTER — Observation Stay (HOSPITAL_COMMUNITY): Payer: Medicare HMO

## 2018-07-21 DIAGNOSIS — R140 Abdominal distension (gaseous): Secondary | ICD-10-CM | POA: Diagnosis not present

## 2018-07-21 LAB — BASIC METABOLIC PANEL
Anion gap: 7 (ref 5–15)
BUN: 16 mg/dL (ref 8–23)
CO2: 25 mmol/L (ref 22–32)
Calcium: 8.5 mg/dL — ABNORMAL LOW (ref 8.9–10.3)
Chloride: 102 mmol/L (ref 98–111)
Creatinine, Ser: 1.5 mg/dL — ABNORMAL HIGH (ref 0.61–1.24)
GFR calc Af Amer: 52 mL/min — ABNORMAL LOW (ref >60)
GFR calc non Af Amer: 45 mL/min — ABNORMAL LOW (ref >60)
Glucose, Bld: 114 mg/dL — ABNORMAL HIGH (ref 70–99)
Potassium: 4.2 mmol/L (ref 3.5–5.1)
Sodium: 134 mmol/L — ABNORMAL LOW (ref 135–145)

## 2018-07-21 LAB — URINE CULTURE: Culture: NO GROWTH

## 2018-07-21 LAB — CBC
HCT: 36.9 % — ABNORMAL LOW (ref 39.0–52.0)
Hemoglobin: 12.3 g/dL — ABNORMAL LOW (ref 13.0–17.0)
MCH: 28.7 pg (ref 26.0–34.0)
MCHC: 33.3 g/dL (ref 30.0–36.0)
MCV: 86.2 fL (ref 80.0–100.0)
Platelets: 156 10*3/uL (ref 150–400)
RBC: 4.28 MIL/uL (ref 4.22–5.81)
RDW: 13.6 % (ref 11.5–15.5)
WBC: 7.1 10*3/uL (ref 4.0–10.5)
nRBC: 0 % (ref 0.0–0.2)

## 2018-07-21 LAB — TROPONIN I: Troponin I: 0.07 ng/mL (ref <0.03)

## 2018-07-21 MED ORDER — CIPROFLOXACIN IN D5W 400 MG/200ML IV SOLN
400.0000 mg | Freq: Two times a day (BID) | INTRAVENOUS | Status: DC
  Filled 2018-07-21: qty 200

## 2018-07-21 MED ORDER — PANTOPRAZOLE SODIUM 40 MG PO TBEC
40.0000 mg | DELAYED_RELEASE_TABLET | Freq: Two times a day (BID) | ORAL | Status: DC
  Administered 2018-07-21 (×2): 40 mg via ORAL
  Filled 2018-07-21 (×2): qty 1

## 2018-07-21 MED ORDER — AMLODIPINE BESYLATE 5 MG PO TABS
5.0000 mg | ORAL_TABLET | Freq: Every day | ORAL | Status: DC
  Administered 2018-07-21: 5 mg via ORAL
  Filled 2018-07-21: qty 1

## 2018-07-21 MED ORDER — SODIUM CHLORIDE 0.9 % IV SOLN
INTRAVENOUS | Status: DC
  Administered 2018-07-21: 10:00:00 via INTRAVENOUS

## 2018-07-21 MED ORDER — HYDRALAZINE HCL 20 MG/ML IJ SOLN: 5.0000 mg | Freq: Four times a day (QID) | INTRAMUSCULAR | Status: DC | PRN

## 2018-07-21 MED ORDER — SODIUM CHLORIDE 0.9 % IV SOLN: INTRAVENOUS | Status: DC

## 2018-07-21 NOTE — Progress Notes (Signed)
[facility] PROGRESS NOTE  Maurice Barron [facility] PRF:[id number] DOB: 11-29-1945 DOA: 07/19/2018 PCP: [name], MD  Assessment/Plan:  1. Acute kidney injury: likely related to dehydration secondary to decreased po intake and 3 new meds (bentyl, tramadol and flexaril) in setting of having only one kidney. continues to improve. Creatinine trending downward.  -continue gentle IV fluids -hold nephrotoxins -recheck in am  2. Orthostatic hypotension. Resolved.  as above.  3. Hyperkalemia: Likely related to acute kidney injury. Patient received Lokelma, insulin, and dextrose in the emergency department. Potassium trending up today but remains within limits of normal.  -gentle IV fluid  -recheck in am  4. Hyponatremia: mild. Likely related to dehydration and volume.   -continue gentle IV fluids.  -bmet in am  5. Near syncope related to orthostasis as above. No further episodes  6. History of chronic systolic congestive heart failure. Remains well compensated. Weight stable. Chart review indicates cards visit 2017 where it was noted that initial EF 35% but with good medical therapy EF now 55%.  -continue BB -daily weights -monitor intake and output  7. Abdominal discomfort. Evaluated by gi who recommended slow bowel prep. Abdominal US without acute abnormality.  Multiple BM's last evening. Pain much improved. Abdomen much softer.  -cipro day #2 - appreciate gi consult -supportive therapy - continue clear liquids until  Gi evaluates  Code Status: full Family Communication: wife and daughter at bedside Disposition Plan: home hopefully tomorrow   Consultants:  [facility]  Procedures: abd [place] Nonspecific gallbladder wall thickening without cholelithiasis or other evidence of acute cholecystitis. Consider nuclear medicine study if there is strong clinical suspicion for acute cholecystitis.  No other acute or significant abnormality. Status post LEFT  nephrectomy  Antibiotics:  cipro 3/14>>  HPI/Subjective: Admitted 3/13 with aki and orthostatic hypotension and abdominal pain. Evaluated by gi who recommended slow bowel prep. Patient feeling much better this am  Objective: Vitals:   07/21/18 0850 07/21/18 1149  BP: (!) 183/86 (!) 179/89  Pulse: 80 79  Resp:  20  Temp:  97.7 F (36.5 C)  SpO2:  99%    Intake/Output Summary (Last 24 hours) at 07/21/2018 1235 Last data filed at 07/21/2018 0400 Gross per 24 hour  Intake 277.5 ml  Output 1660 ml  Net -1382.5 ml   Filed Weights   07/19/18 1329 07/19/18 1800  Weight: 81.2 kg 81.8 kg    Exam:   General:  Ambulating in hall with steady gait  Cardiovascular: rrr no mgr no le edema  Respiratory: normal effort BS clear bilaterally no wheeze  Abdomen: soft +BS no guarding or rebounding  Musculoskeletal: joints without swelling/erythema   Data Reviewed: Basic Metabolic Panel: Recent Labs  Lab 07/17/18 1444 07/19/18 1422 07/19/18 2004 07/20/18 0535 07/21/18 0723  NA 134* 131* 135 135 134*  K 4.2 6.1* 4.0 3.9 4.2  CL 99 97* 102 101 102  CO2 27 20* 24 24 25   GLUCOSE 108* 99 89 134* 114*  BUN 19 27* 25* 27* 16  CREATININE 1.41* 2.13* 2.17* 1.90* 1.50*  CALCIUM 9.6 8.9 8.8* 8.8* 8.5*   Liver Function Tests: Recent Labs  Lab 07/17/18 1444  AST 22  ALT 20  ALKPHOS 67  BILITOT 0.8  PROT 7.7  ALBUMIN 4.4   Recent Labs  Lab 07/17/18 1444  LIPASE 43   No results for input(s): AMMONIA in the last 168 hours. CBC: Recent Labs  Lab 07/17/18 1444 07/19/18 1422 07/21/18 0723  WBC 9.8 8.8 7.1  NEUTROABS  --  6.6  --   HGB 14.0 13.3 12.3*  HCT 44.5 42.7 36.9*  MCV 89.4 87.5 86.2  PLT 211 187 156   Cardiac Enzymes: Recent Labs  Lab 07/19/18 1430 07/20/18 1156 07/21/18 0723  TROPONINI 0.05* 0.06* 0.07*   BNP (last 3 results) No results for input(s): BNP in the last 8760 hours.  ProBNP (last 3 results) No results for input(s): PROBNP in the last  8760 hours.  CBG: No results for input(s): GLUCAP in the last 168 hours.  Recent Results (from the past 240 hour(s))  Culture, Urine     Status: None   Collection Time: 07/20/18 11:37 AM  Result Value Ref Range Status   Specimen Description URINE, RANDOM  Final   Special Requests NONE  Final   Culture   Final    NO GROWTH Performed at [facility], [address] [place], [place] [address]    Report Status 07/21/2018 FINAL  Final     Studies: Dg Lumbar Spine 2-3 Views  Result Date: 07/20/2018 CLINICAL DATA:  Frequent falls.  Pain. EXAM: LUMBAR SPINE - 2-3 VIEW COMPARISON:  March 19, 2019 FINDINGS: Contrast in the colon overlies the upper lumbar spine limiting evaluation on the frontal view. Anterior wedging of T11 is stable since July 17, 2018. No acute fractures in the lumbar spine. No malalignment. Multilevel degenerative disc disease and lower lumbar facet degenerative changes. IMPRESSION: Degenerative changes. No acute fractures or malalignment identified. Electronically Signed   By: Dorise Bullion III M.D   On: 07/20/2018 13:37   Ct Head Wo Contrast  Result Date: 07/19/2018 CLINICAL DATA:  Syncope and falls. EXAM: CT HEAD WITHOUT CONTRAST CT CERVICAL SPINE WITHOUT CONTRAST TECHNIQUE: Multidetector CT imaging of the head and cervical spine was performed following the standard protocol without intravenous contrast. Multiplanar CT image reconstructions of the cervical spine were also generated. COMPARISON:  None. FINDINGS: CT HEAD FINDINGS Brain: No evidence of acute infarction, hemorrhage, hydrocephalus, extra-axial collection or mass lesion/mass effect. Vascular: No hyperdense vessel or unexpected calcification. Skull: Normal. Negative for fracture or focal lesion. Sinuses/Orbits: No acute finding. Other: None. CT CERVICAL SPINE FINDINGS Alignment: Normal. Skull base and vertebrae: No acute fracture. No primary bone lesion or focal pathologic process. Soft tissues and spinal  canal: No prevertebral fluid or swelling. No visible canal hematoma. Disc levels: Severe degenerative disc disease is noted at C5-6 and C6-7 with anterior osteophyte formation. Upper chest: Negative. Other: Degenerative changes are seen involving posterior facet joints bilaterally. IMPRESSION: Normal head CT. Multilevel degenerative disc disease is noted in the cervical spine. No acute abnormality is seen. Electronically Signed   By: Marijo Conception, M.D.   On: 07/19/2018 15:05   Ct Cervical Spine Wo Contrast  Result Date: 07/19/2018 CLINICAL DATA:  Syncope and falls. EXAM: CT HEAD WITHOUT CONTRAST CT CERVICAL SPINE WITHOUT CONTRAST TECHNIQUE: Multidetector CT imaging of the head and cervical spine was performed following the standard protocol without intravenous contrast. Multiplanar CT image reconstructions of the cervical spine were also generated. COMPARISON:  None. FINDINGS: CT HEAD FINDINGS Brain: No evidence of acute infarction, hemorrhage, hydrocephalus, extra-axial collection or mass lesion/mass effect. Vascular: No hyperdense vessel or unexpected calcification. Skull: Normal. Negative for fracture or focal lesion. Sinuses/Orbits: No acute finding. Other: None. CT CERVICAL SPINE FINDINGS Alignment: Normal. Skull base and vertebrae: No acute fracture. No primary bone lesion or focal pathologic process. Soft tissues and spinal canal: No prevertebral fluid or swelling. No visible canal hematoma. Disc levels: Severe  degenerative disc disease is noted at C5-6 and C6-7 with anterior osteophyte formation. Upper chest: Negative. Other: Degenerative changes are seen involving posterior facet joints bilaterally. IMPRESSION: Normal head CT. Multilevel degenerative disc disease is noted in the cervical spine. No acute abnormality is seen. Electronically Signed   By: Marijo Conception, M.D.   On: 07/19/2018 15:05   US Abdomen Complete  Result Date: 07/21/2018 CLINICAL DATA:  73 year old male with abdominal pain  and distension. History of LEFT nephrectomy. EXAM: ABDOMEN ULTRASOUND COMPLETE COMPARISON:  07/17/2018 CT FINDINGS: Gallbladder: Gallbladder wall thickening is noted. There is no evidence of cholelithiasis, sonographic Murphy sign or pericholecystic fluid. Common bile duct: Diameter: 4 mm. No intrahepatic or extrahepatic biliary dilatation. Liver: No focal lesion identified. Within normal limits in parenchymal echogenicity. Portal vein is patent on color Doppler imaging with normal direction of blood flow towards the liver. IVC: No abnormality visualized. Pancreas: Visualized portion unremarkable. Spleen: Size and appearance within normal limits. Right Kidney: Length: 4.5 cm. RIGHT renal cysts are identified, the largest measuring 1.9 cm. Echogenicity within normal limits. No mass or hydronephrosis visualized. Left Kidney: Not visualized compatible with nephrectomy. Abdominal aorta: No aneurysm visualized. Other findings: None. IMPRESSION: 1. Nonspecific gallbladder wall thickening without cholelithiasis or other evidence of acute cholecystitis. Consider nuclear medicine study if there is strong clinical suspicion for acute cholecystitis. 2. No other acute or significant abnormality. 3. Status post LEFT nephrectomy. Electronically Signed   By: Margarette Canada M.D.   On: 07/21/2018 08:23    Scheduled Meds: . carvedilol  25 mg Oral BID WC  . docusate sodium  100 mg Oral Daily  . heparin  5,000 Units Subcutaneous Q8H  . pantoprazole  40 mg Oral BID  . sodium chloride flush  3 mL Intravenous Q12H  . tamsulosin  0.4 mg Oral QPC supper   Continuous Infusions: . sodium chloride 75 mL/hr at 07/21/18 0933  . ciprofloxacin 400 mg (07/20/18 1224)    Principal Problem:   AKI (acute kidney injury) ([place]) Active Problems:   Orthostatic hypotension   Hyperkalemia   Near syncope   Abdominal pain   Systolic CHF ([place])   Hyponatremia    Time spent: 45 minutes    Dyanne Carrel, NP [facility]  If  7PM-7AM, please contact night-coverage at [email or web address], password [facility] 07/21/2018, 12:35 PM  LOS: 0 days

## 2018-07-21 NOTE — H&P (View-Only) (Signed)
Subjective: Nausea improving. Appetite improving. Lots of stool output with bowel preparation yesterday.  Objective: Vital signs in last 24 hours: Temp:  [97.4 F (36.3 C)-98.1 F (36.7 C)] 97.7 F (36.5 C) (03/15 1149) Pulse Rate:  [79-83] 79 (03/15 1149) Resp:  [17-20] 20 (03/15 1149) BP: (166-186)/(78-91) 179/89 (03/15 1149) SpO2:  [96 %-99 %] 99 % (03/15 1149) Weight change:  Last BM Date: 07/21/18  PE: GEN:  NAD ABD:  Soft, non-tender  Lab Results: CBC    Component Value Date/Time   WBC 7.1 07/21/2018 0723   RBC 4.28 07/21/2018 0723   HGB 12.3 (L) 07/21/2018 0723   HCT 36.9 (L) 07/21/2018 0723   PLT 156 07/21/2018 0723   MCV 86.2 07/21/2018 0723   MCH 28.7 07/21/2018 0723   MCHC 33.3 07/21/2018 0723   RDW 13.6 07/21/2018 0723   LYMPHSABS 1.2 07/19/2018 1422   MONOABS 0.8 07/19/2018 1422   EOSABS 0.1 07/19/2018 1422   BASOSABS 0.1 07/19/2018 1422   CMP     Component Value Date/Time   NA 134 (L) 07/21/2018 0723   K 4.2 07/21/2018 0723   CL 102 07/21/2018 0723   CO2 25 07/21/2018 0723   GLUCOSE 114 (H) 07/21/2018 0723   BUN 16 07/21/2018 0723   CREATININE 1.50 (H) 07/21/2018 0723   CALCIUM 8.5 (L) 07/21/2018 0723   PROT 7.7 07/17/2018 1444   ALBUMIN 4.4 07/17/2018 1444   AST 22 07/17/2018 1444   ALT 20 07/17/2018 1444   ALKPHOS 67 07/17/2018 1444   BILITOT 0.8 07/17/2018 1444   GFRNONAA 45 (L) 07/21/2018 0723   GFRAA 52 (L) 07/21/2018 0723   Assessment:  1.  Migratory abdominal pain.  Suspect at least in part constipation-mediated. 2.  Nausea, poor appetite.  Suspect at least in part constipation-mediated.  No clear evidence of gallbladder disease on U/S, and recent CT scan (albeit uninfused) unrevealing.  Plan:  1.  Soft diet today. 2.  PPI. 3.  EGD tomorrow. 4.  Pending endoscopy findings, suspect patient will most likely be able to be discharged home tomorrow from GI perspective. 5.  [facility] will follow.   Landry Dyke 07/21/2018,  3:57 PM   Cell [phone] If no answer or after 5 PM call [phone]

## 2018-07-21 NOTE — Progress Notes (Signed)
Pt and family do not feel comfortable to sign consent at this time  Stated will sign tomorrow after further discussion with MD

## 2018-07-21 NOTE — Progress Notes (Signed)
Subjective: Nausea improving. Appetite improving. Lots of stool output with bowel preparation yesterday.  Objective: Vital signs in last 24 hours: Temp:  [97.4 F (36.3 C)-98.1 F (36.7 C)] 97.7 F (36.5 C) (03/15 1149) Pulse Rate:  [79-83] 79 (03/15 1149) Resp:  [17-20] 20 (03/15 1149) BP: (166-186)/(78-91) 179/89 (03/15 1149) SpO2:  [96 %-99 %] 99 % (03/15 1149) Weight change:  Last BM Date: 07/21/18  PE: GEN:  NAD ABD:  Soft, non-tender  Lab Results: CBC    Component Value Date/Time   WBC 7.1 07/21/2018 0723   RBC 4.28 07/21/2018 0723   HGB 12.3 (L) 07/21/2018 0723   HCT 36.9 (L) 07/21/2018 0723   PLT 156 07/21/2018 0723   MCV 86.2 07/21/2018 0723   MCH 28.7 07/21/2018 0723   MCHC 33.3 07/21/2018 0723   RDW 13.6 07/21/2018 0723   LYMPHSABS 1.2 07/19/2018 1422   MONOABS 0.8 07/19/2018 1422   EOSABS 0.1 07/19/2018 1422   BASOSABS 0.1 07/19/2018 1422   CMP     Component Value Date/Time   NA 134 (L) 07/21/2018 0723   K 4.2 07/21/2018 0723   CL 102 07/21/2018 0723   CO2 25 07/21/2018 0723   GLUCOSE 114 (H) 07/21/2018 0723   BUN 16 07/21/2018 0723   CREATININE 1.50 (H) 07/21/2018 0723   CALCIUM 8.5 (L) 07/21/2018 0723   PROT 7.7 07/17/2018 1444   ALBUMIN 4.4 07/17/2018 1444   AST 22 07/17/2018 1444   ALT 20 07/17/2018 1444   ALKPHOS 67 07/17/2018 1444   BILITOT 0.8 07/17/2018 1444   GFRNONAA 45 (L) 07/21/2018 0723   GFRAA 52 (L) 07/21/2018 0723   Assessment:  1.  Migratory abdominal pain.  Suspect at least in part constipation-mediated. 2.  Nausea, poor appetite.  Suspect at least in part constipation-mediated.  No clear evidence of gallbladder disease on U/S, and recent CT scan (albeit uninfused) unrevealing.  Plan:  1.  Soft diet today. 2.  PPI. 3.  EGD tomorrow. 4.  Pending endoscopy findings, suspect patient will most likely be able to be discharged home tomorrow from GI perspective. 5.  [facility] will follow.   Landry Dyke 07/21/2018,  3:57 PM   Cell [phone] If no answer or after 5 PM call [phone]

## 2018-07-22 ENCOUNTER — Encounter (HOSPITAL_COMMUNITY)
Admission: EM | Disposition: A | Discharge status: Home / Self Care | Payer: Self-pay | Source: Ambulatory Visit | Attending: Internal Medicine

## 2018-07-22 ENCOUNTER — Inpatient Hospital Stay (HOSPITAL_COMMUNITY): Payer: Medicare HMO

## 2018-07-22 ENCOUNTER — Observation Stay (HOSPITAL_COMMUNITY): Payer: Medicare HMO | Admitting: Anesthesiology

## 2018-07-22 ENCOUNTER — Other Ambulatory Visit: Payer: Self-pay | Admitting: Cardiology

## 2018-07-22 ENCOUNTER — Observation Stay (HOSPITAL_BASED_OUTPATIENT_CLINIC_OR_DEPARTMENT_OTHER): Payer: Medicare HMO

## 2018-07-22 ENCOUNTER — Encounter (HOSPITAL_COMMUNITY): Payer: Self-pay

## 2018-07-22 DIAGNOSIS — R296 Repeated falls: Secondary | ICD-10-CM | POA: Diagnosis present

## 2018-07-22 DIAGNOSIS — N179 Acute kidney failure, unspecified: Secondary | ICD-10-CM | POA: Diagnosis present

## 2018-07-22 DIAGNOSIS — Z87891 Personal history of nicotine dependence: Secondary | ICD-10-CM | POA: Diagnosis not present

## 2018-07-22 DIAGNOSIS — I4891 Unspecified atrial fibrillation: Secondary | ICD-10-CM | POA: Diagnosis present

## 2018-07-22 DIAGNOSIS — I351 Nonrheumatic aortic (valve) insufficiency: Secondary | ICD-10-CM

## 2018-07-22 DIAGNOSIS — E875 Hyperkalemia: Secondary | ICD-10-CM | POA: Diagnosis present

## 2018-07-22 DIAGNOSIS — I5022 Chronic systolic (congestive) heart failure: Secondary | ICD-10-CM | POA: Diagnosis present

## 2018-07-22 DIAGNOSIS — E871 Hypo-osmolality and hyponatremia: Secondary | ICD-10-CM | POA: Diagnosis present

## 2018-07-22 DIAGNOSIS — I480 Paroxysmal atrial fibrillation: Secondary | ICD-10-CM | POA: Diagnosis not present

## 2018-07-22 DIAGNOSIS — N400 Enlarged prostate without lower urinary tract symptoms: Secondary | ICD-10-CM | POA: Diagnosis present

## 2018-07-22 DIAGNOSIS — I2510 Atherosclerotic heart disease of native coronary artery without angina pectoris: Secondary | ICD-10-CM

## 2018-07-22 DIAGNOSIS — K5790 Diverticulosis of intestine, part unspecified, without perforation or abscess without bleeding: Secondary | ICD-10-CM | POA: Diagnosis present

## 2018-07-22 DIAGNOSIS — K2970 Gastritis, unspecified, without bleeding: Secondary | ICD-10-CM | POA: Diagnosis present

## 2018-07-22 DIAGNOSIS — E785 Hyperlipidemia, unspecified: Secondary | ICD-10-CM | POA: Diagnosis present

## 2018-07-22 DIAGNOSIS — I951 Orthostatic hypotension: Secondary | ICD-10-CM | POA: Diagnosis present

## 2018-07-22 DIAGNOSIS — R4702 Dysphasia: Secondary | ICD-10-CM | POA: Diagnosis present

## 2018-07-22 DIAGNOSIS — I252 Old myocardial infarction: Secondary | ICD-10-CM | POA: Diagnosis not present

## 2018-07-22 DIAGNOSIS — R918 Other nonspecific abnormal finding of lung field: Secondary | ICD-10-CM | POA: Diagnosis not present

## 2018-07-22 DIAGNOSIS — R630 Anorexia: Secondary | ICD-10-CM | POA: Diagnosis present

## 2018-07-22 DIAGNOSIS — I110 Hypertensive heart disease with heart failure: Secondary | ICD-10-CM | POA: Diagnosis present

## 2018-07-22 DIAGNOSIS — E860 Dehydration: Secondary | ICD-10-CM | POA: Diagnosis present

## 2018-07-22 DIAGNOSIS — Z905 Acquired absence of kidney: Secondary | ICD-10-CM | POA: Diagnosis not present

## 2018-07-22 DIAGNOSIS — Z880 Allergy status to penicillin: Secondary | ICD-10-CM | POA: Diagnosis not present

## 2018-07-22 DIAGNOSIS — K2980 Duodenitis without bleeding: Secondary | ICD-10-CM | POA: Diagnosis not present

## 2018-07-22 DIAGNOSIS — K449 Diaphragmatic hernia without obstruction or gangrene: Secondary | ICD-10-CM | POA: Diagnosis present

## 2018-07-22 DIAGNOSIS — K222 Esophageal obstruction: Secondary | ICD-10-CM | POA: Diagnosis present

## 2018-07-22 DIAGNOSIS — R531 Weakness: Secondary | ICD-10-CM | POA: Diagnosis present

## 2018-07-22 DIAGNOSIS — K22719 Barrett's esophagus with dysplasia, unspecified: Secondary | ICD-10-CM | POA: Diagnosis not present

## 2018-07-22 DIAGNOSIS — R5381 Other malaise: Secondary | ICD-10-CM | POA: Diagnosis present

## 2018-07-22 HISTORY — PX: BIOPSY: SHX5522

## 2018-07-22 HISTORY — PX: ESOPHAGOGASTRODUODENOSCOPY (EGD) WITH PROPOFOL: SHX5813

## 2018-07-22 LAB — BASIC METABOLIC PANEL
Anion gap: 9 (ref 5–15)
BUN: 14 mg/dL (ref 8–23)
CO2: 25 mmol/L (ref 22–32)
Calcium: 9 mg/dL (ref 8.9–10.3)
Chloride: 99 mmol/L (ref 98–111)
Creatinine, Ser: 1.52 mg/dL — ABNORMAL HIGH (ref 0.61–1.24)
GFR calc Af Amer: 52 mL/min — ABNORMAL LOW (ref >60)
GFR calc non Af Amer: 44 mL/min — ABNORMAL LOW (ref >60)
Glucose, Bld: 120 mg/dL — ABNORMAL HIGH (ref 70–99)
Potassium: 3.8 mmol/L (ref 3.5–5.1)
Sodium: 133 mmol/L — ABNORMAL LOW (ref 135–145)

## 2018-07-22 LAB — ECHOCARDIOGRAM COMPLETE
Height: 68 in
Weight: 2885.38 oz

## 2018-07-22 SURGERY — ESOPHAGOGASTRODUODENOSCOPY (EGD) WITH PROPOFOL
Anesthesia: Monitor Anesthesia Care | Laterality: Left

## 2018-07-22 MED ORDER — PROPOFOL 10 MG/ML IV BOLUS
INTRAVENOUS | Status: DC | PRN
  Administered 2018-07-22: 40 mg via INTRAVENOUS

## 2018-07-22 MED ORDER — DILTIAZEM HCL 100 MG IV SOLR
INTRAVENOUS | Status: DC | PRN
  Administered 2018-07-22: 5 mg/h via INTRAVENOUS

## 2018-07-22 MED ORDER — ESMOLOL HCL 100 MG/10ML IV SOLN
INTRAVENOUS | Status: DC | PRN
  Administered 2018-07-22 (×3): 20 mg via INTRAVENOUS

## 2018-07-22 MED ORDER — LIDOCAINE HCL (CARDIAC) PF 100 MG/5ML IV SOSY
PREFILLED_SYRINGE | INTRAVENOUS | Status: DC | PRN
  Administered 2018-07-22: 60 mg via INTRATRACHEAL

## 2018-07-22 MED ORDER — METOPROLOL TARTRATE 5 MG/5ML IV SOLN
INTRAVENOUS | Status: DC | PRN
  Administered 2018-07-22 (×8): 1 mg via INTRAVENOUS
  Administered 2018-07-22: 2 mg via INTRAVENOUS

## 2018-07-22 MED ORDER — DILTIAZEM LOAD VIA INFUSION
INTRAVENOUS | Status: DC | PRN
  Administered 2018-07-22 (×3): 5 mg via INTRAVENOUS

## 2018-07-22 MED ORDER — CARVEDILOL 25 MG PO TABS
37.5000 mg | ORAL_TABLET | Freq: Two times a day (BID) | ORAL | Status: DC
  Administered 2018-07-22 – 2018-07-23 (×2): 37.5 mg via ORAL
  Filled 2018-07-22: qty 1

## 2018-07-22 MED ORDER — CARVEDILOL 25 MG PO TABS
37.5000 mg | ORAL_TABLET | Freq: Two times a day (BID) | ORAL | Status: DC
  Filled 2018-07-22: qty 1

## 2018-07-22 MED ORDER — PROPOFOL 500 MG/50ML IV EMUL
INTRAVENOUS | Status: DC | PRN
  Administered 2018-07-22: 50 ug/kg/min via INTRAVENOUS

## 2018-07-22 MED ORDER — PANTOPRAZOLE SODIUM 40 MG PO TBEC
40.0000 mg | DELAYED_RELEASE_TABLET | Freq: Two times a day (BID) | ORAL | Status: DC
  Administered 2018-07-22 – 2018-07-23 (×2): 40 mg via ORAL
  Filled 2018-07-22 (×2): qty 1

## 2018-07-22 MED ORDER — APIXABAN 5 MG PO TABS
5.0000 mg | ORAL_TABLET | Freq: Two times a day (BID) | ORAL | Status: DC
  Administered 2018-07-22 – 2018-07-23 (×2): 5 mg via ORAL
  Filled 2018-07-22 (×3): qty 1

## 2018-07-22 MED ORDER — TRAZODONE HCL 50 MG PO TABS
50.0000 mg | ORAL_TABLET | Freq: Once | ORAL | Status: AC
  Administered 2018-07-22: 50 mg via ORAL
  Filled 2018-07-22: qty 1

## 2018-07-22 MED ORDER — PHENYLEPHRINE HCL 10 MG/ML IJ SOLN
INTRAMUSCULAR | Status: DC | PRN
  Administered 2018-07-22: 80 ug via INTRAVENOUS
  Administered 2018-07-22: 120 ug via INTRAVENOUS
  Administered 2018-07-22: 80 ug via INTRAVENOUS

## 2018-07-22 MED ORDER — APIXABAN 5 MG PO TABS: 5.0000 mg | ORAL_TABLET | Freq: Two times a day (BID) | ORAL | 11 refills | Status: DC

## 2018-07-22 MED ORDER — PANTOPRAZOLE SODIUM 40 MG IV SOLR: 40.0000 mg | Freq: Two times a day (BID) | INTRAVENOUS | Status: DC

## 2018-07-22 MED ORDER — ONDANSETRON HCL 4 MG/2ML IJ SOLN
INTRAMUSCULAR | Status: DC | PRN
  Administered 2018-07-22: 4 mg via INTRAVENOUS

## 2018-07-22 MED ORDER — METOPROLOL TARTRATE 5 MG/5ML IV SOLN
INTRAVENOUS | Status: AC
  Filled 2018-07-22: qty 5

## 2018-07-22 MED ORDER — SODIUM CHLORIDE 0.9 % IV SOLN
100.0000 mg | Freq: Two times a day (BID) | INTRAVENOUS | Status: DC
  Administered 2018-07-22 – 2018-07-23 (×2): 100 mg via INTRAVENOUS
  Filled 2018-07-22 (×3): qty 100

## 2018-07-22 MED ORDER — LACTATED RINGERS IV SOLN
INTRAVENOUS | Status: DC
  Administered 2018-07-22 (×2): via INTRAVENOUS

## 2018-07-22 MED ORDER — SODIUM CHLORIDE 0.9 % IV BOLUS
500.0000 mL | Freq: Once | INTRAVENOUS | Status: AC
  Administered 2018-07-22: 500 mL via INTRAVENOUS

## 2018-07-22 MED FILL — ELIQUIS 5 MG TABLET: 5 | 3 days supply | Qty: 60 | Fill #0 | Status: TO

## 2018-07-22 SURGICAL SUPPLY — 15 items

## 2018-07-22 NOTE — Anesthesia Preprocedure Evaluation (Addendum)
Anesthesia Evaluation  Patient identified by MRN, date of birth, ID band Patient awake    Reviewed: Allergy & Precautions, NPO status , Patient's Chart, lab work & pertinent test results, reviewed documented beta blocker date and time   Airway Mallampati: I  TM Distance: >3 FB Neck ROM: Full    Dental no notable dental hx. (+) Teeth Intact, Dental Advisory Given   Pulmonary neg pulmonary ROS, former smoker,    Pulmonary exam normal breath sounds clear to auscultation       Cardiovascular + CAD (on plavix), + Past MI and +CHF  Normal cardiovascular exam Rhythm:Regular Rate:Normal  Stress Test 2008 EF 50%  TTE 2007 EF 50-55%  No recent cardiac testing per patient   Neuro/Psych PSYCHIATRIC DISORDERS Anxiety negative neurological ROS     GI/Hepatic negative GI ROS, Neg liver ROS,   Endo/Other  negative endocrine ROS  Renal/GU negative Renal ROS  negative genitourinary   Musculoskeletal negative musculoskeletal ROS (+)   Abdominal   Peds  Hematology negative hematology ROS (+)   Anesthesia Other Findings EGD for abdominal pain, nausea  Reproductive/Obstetrics                           Anesthesia Physical Anesthesia Plan  ASA: III  Anesthesia Plan: MAC   Post-op Pain Management:    Induction: Intravenous  PONV Risk Score and Plan: 1 and Propofol infusion and Treatment may vary due to age or medical condition  Airway Management Planned: Natural Airway and Nasal Cannula  Additional Equipment:   Intra-op Plan:   Post-operative Plan:   Informed Consent: I have reviewed the patients History and Physical, chart, labs and discussed the procedure including the risks, benefits and alternatives for the proposed anesthesia with the patient or authorized representative who has indicated his/her understanding and acceptance.     Dental advisory given  Plan Discussed with:  CRNA  Anesthesia Plan Comments:        Anesthesia Quick Evaluation

## 2018-07-22 NOTE — Consult Note (Addendum)
Cardiology Consultation:   Patient ID: Maurice Barron MRN: [id number]; DOB: February 18, 1946  Admit date: 07/19/2018 Date of Consult: 07/22/2018  Primary Care Provider: [name], MD Primary Cardiologist: Mertie Moores, MD  Primary Electrophysiologist:  None   Patient Profile:   Maurice Barron is a 73 y.o. male with a hx of CAD, h/o systolic HF w/ improvement in LVEF, h/o PVCs, HLD, BPH and recent GI complaints, scheduled for elective EGD today, who is being seen today for the evaluation of new onset atrial fibrillation,  at the request of Dr. Eliseo Squires, Internal Medicine.   History of Present Illness:   Maurice Barron is a 73 y.o. male with a hx of CAD, h/o systolic HF w/ improvement in LVEF, h/o PVCs, HLD and recent GI complaints, scheduled for elective EGD today, who is being seen today for the evaluation of new onset atrial fibrillation,  at the request of Dr. Eliseo Squires, Internal Medicine.   He is followed by Dr. Acie Fredrickson. He had a heart catheterization in 2005. During the heart catheterization he was found to have a ruptured plaque in his proximal left anterior descending artery. He had moderate irregularities in the other coronary arteries. He was started on Plavix at that time.  He's had a history of congestive heart failure. His initial ejection fraction was around 35%. With good medical therapy, his ejection fraction improved to 55%.  He also has HLD, and lipids are followed by PCP. He was last seen by Dr. Acie Fredrickson, in 2017 and was doing ok from a cardiac standpoint. He was instructed to f/u in 1 year but has failed to f/u with cardiology.   He was admitted by IM last week for syncope at PCP office. Has had increased weakness, multiple falls and near syncope the days leading up to admission. He was hypotensive at PCP office w/ BP at 68/52. Felt to be orthostatic. BP improved w/ IVFs. Also found to have an AKI from reduced PO intake and hypotension. Renal function improved w/ IVFs. SCr down  from 1.90>>1.52. He has recently had GI symptoms as well. Migratory abdominal pain, constipation, nausea and poor appetite. No clear evidence of gallbladder disease on U/S, and recent CT scan (albeit uninfused) unrevealing, other than enlarged prostate. GI consulted. Pt seen by Dr. Paulita Fujita who recommended EGD.   EGD was done this morning and showed mild gastritis.  Small tongue of salmon-colored mucosa GE junction.  Biopsies performed. PPI therapy recommended. During recovering in Endoscopy, pt went into atrial fibrillation w/ RVR, 118 bpm but completely asymptomatic. Pt was given a dose of esmolol and then started on IV Cardizem drip 7.5 mg/kg/ hr and during my examination spontaneously converted to NSR. HR in the 70s. He denies any h/o stroke/ TIA. No h/o DM.   Past Medical History:  Diagnosis Date  . Anxiety   . CHF (congestive heart failure) (HCC)    EF 35%. WITH MEDICATION HIS EF IS NOW 55%  . Coronary artery disease    ruptured plaque in the LAD - started Plavix at that point.  . Diverticulosis   . Enlarged prostate   . Hyperlipidemia   . PVC's (premature ventricular contractions)   . Renal cyst     Past Surgical History:  Procedure Laterality Date  . CARDIAC CATHETERIZATION  12/04/2003   EF 30-35%  . CARDIOVASCULAR STRESS TEST  08/20/2006   EF 50%  . HYDROCELE EXCISION / REPAIR    . NEPHRECTOMY    . TONSILLECTOMY    .  US ECHOCARDIOGRAPHY  08/07/2005   EF 50-55%     Home Medications:  Prior to Admission medications   Medication Sig Start Date End Date Taking? Authorizing Provider  acetaminophen (TYLENOL) 325 MG tablet Take 650 mg by mouth every 6 (six) hours as needed for headache (pain).   Yes [provider]  aspirin EC 81 MG tablet Take 81 mg by mouth at bedtime.   Yes [provider]  carvedilol (COREG) 25 MG tablet Take 25 mg by mouth 2 (two) times daily with a meal.     Yes [provider]  Cholecalciferol (VITAMIN D3 PO) Take 1 tablet by mouth  at bedtime.   Yes [provider]  clopidogrel (PLAVIX) 75 MG tablet Take 75 mg by mouth daily.     Yes [provider]  cyclobenzaprine (FLEXERIL) 5 MG tablet Take 5 mg by mouth every 8 (eight) hours as needed for muscle spasms.   Yes [provider]  dicyclomine (BENTYL) 20 MG tablet Take 1 tablet (20 mg total) by mouth 2 (two) times daily. 07/17/18  Yes Leaphart, Chrissie Noa T, PA-C  fish oil-omega-3 fatty acids 1000 MG capsule Take 1 g by mouth at bedtime.    Yes [provider]  ipratropium (ATROVENT) 0.03 % nasal spray Place 2 sprays into both nostrils 3 (three) times daily as needed (runny nose).    Yes [provider]  irbesartan (AVAPRO) 75 MG tablet Take 75 mg by mouth daily.   Yes [provider]  loratadine (CLARITIN) 10 MG tablet Take 10 mg by mouth daily as needed (seasonal allergies).   Yes [provider]  Minoxidil (ROGAINE EX) Apply 1 application topically daily. Shampoo with Rogaine daily   Yes [provider]  ondansetron (ZOFRAN) 8 MG tablet Take 8 mg by mouth every 6 (six) hours as needed for nausea or vomiting.  07/18/18  Yes [provider]  pantoprazole (PROTONIX) 40 MG tablet Take 40 mg by mouth 2 (two) times daily. 07/16/18  Yes [provider]  [place] into the lungs at bedtime. CPAP   Yes [provider]  rosuvastatin (CRESTOR) 40 MG tablet Take 40 mg by mouth at bedtime.    Yes [provider]  traMADol (ULTRAM) 50 MG tablet Take 50 mg by mouth every 8 (eight) hours as needed (pain).  07/18/18  Yes [provider]    Inpatient Medications: Scheduled Meds: . [MAR Hold] amLODipine  5 mg Oral Daily  . [MAR Hold] carvedilol  25 mg Oral BID WC  . [MAR Hold] docusate sodium  100 mg Oral Daily  . [MAR Hold] heparin  5,000 Units Subcutaneous Q8H  . [MAR Hold] pantoprazole  40 mg Oral BID  . [MAR Hold] sodium chloride flush  3 mL Intravenous  Q12H  . [MAR Hold] tamsulosin  0.4 mg Oral QPC supper   Continuous Infusions: . sodium chloride    . [MAR Hold] ciprofloxacin    . lactated ringers 10 mL/hr at 07/22/18 0842   PRN Meds: [MAR Hold] acetaminophen **OR** [MAR Hold] acetaminophen, [MAR Hold] hydrALAZINE, [MAR Hold] methocarbamol, [MAR Hold]  morphine injection, [MAR Hold] ondansetron **OR** [MAR Hold] ondansetron (ZOFRAN) IV, [MAR Hold] polyethylene glycol, [MAR Hold] traMADol  Allergies:    Allergies  Allergen Reactions  . Penicillins Other (See Comments)    Did it involve swelling of the face/tongue/throat, SOB, or low BP? Unknown Did it involve sudden or severe rash/hives, skin peeling, or any reaction on the inside  of your mouth or nose? Unknown Did you need to seek medical attention at a hospital or doctor's office? Unknown When did it last happen?toddler? If all above answers are "NO", may proceed with cephalosporin use.    Social History:   Social History   Socioeconomic History  . Marital status: Married    Spouse name: Not on file  . Number of children: Not on file  . Years of education: Not on file  . Highest education level: Not on file  Occupational History  . Not on file  Social Needs  . Financial resource strain: Not on file  . Food insecurity:    Worry: Not on file    Inability: Not on file  . Transportation needs:    Medical: Not on file    Non-medical: Not on file  Tobacco Use  . Smoking status: Former Smoker    Last attempt to quit: 05/08/1992    Years since quitting: 26.2  . Smokeless tobacco: Never Used  Substance and Sexual Activity  . Alcohol use: Yes    Comment: occ  . Drug use: No  . Sexual activity: Not on file  Lifestyle  . Physical activity:    Days per week: Not on file    Minutes per session: Not on file  . Stress: Not on file  Relationships  . Social connections:    Talks on phone: Not on file    Gets together: Not on file    Attends religious service: Not on  file    Active member of club or organization: Not on file    Attends meetings of clubs or organizations: Not on file    Relationship status: Not on file  . Intimate partner violence:    Fear of current or ex partner: Not on file    Emotionally abused: Not on file    Physically abused: Not on file    Forced sexual activity: Not on file  Other Topics Concern  . Not on file  Social History Narrative  . Not on file    Family History:    Family History  Problem Relation Age of Onset  . Heart disease Mother   . Sarcoidosis Mother   . Heart attack Father   . Heart attack Brother   . Coronary artery disease Brother      ROS:  Please see the history of present illness.   All other ROS reviewed and negative.     Physical Exam/Data:   Vitals:   07/21/18 1719 07/21/18 1827 07/21/18 2221 07/22/18 0833  BP: (!) 192/93 (!) 160/75 (!) 155/78 (!) 172/83  Pulse: 88  90 88  Resp: 18  18 18   Temp: 98.4 F (36.9 C)  97.9 F (36.6 C) 98 F (36.7 C)  TempSrc: Oral  Oral Oral  SpO2: 96%  98% 93%  Weight:    81.8 kg  Height:    5\' 8"  (1.727 m)    Intake/Output Summary (Last 24 hours) at 07/22/2018 1105 Last data filed at 07/22/2018 1031 Gross per 24 hour  Intake 2166.71 ml  Output 25 ml  Net 2141.71 ml   Last 3 Weights 07/22/2018 07/19/2018 07/19/2018  Weight (lbs) 180 lb 5.4 oz 180 lb 5.4 oz 179 lb  Weight (kg) 81.8 kg 81.8 kg 81.194 kg     Body mass index is 27.42 kg/m.  General:  Well nourished, well developed, in no acute distress HEENT: normal Lymph: no adenopathy Neck: no JVD Endocrine:  No thryomegaly  Vascular: No carotid bruits; FA pulses 2+ bilaterally without bruits  Cardiac:  normal S1, S2; RRR; no murmur  Lungs:  clear to auscultation bilaterally, no wheezing, rhonchi or rales  Abd: soft, nontender, no hepatomegaly  Ext: no edema Musculoskeletal:  No deformities, BUE and BLE strength normal and equal Skin: warm and dry  Neuro:  CNs 2-12 intact, no focal  abnormalities noted Psych:  Normal affect   EKG:  The EKG was personally reviewed and demonstrates:  Atrial fibrillation w/ RVR 118 bpm Telemetry:  Telemetry was personally reviewed and demonstrates:  afib with spontaneous conversion back to NSR during my examination. HR in the 70s.   Relevant CV Studies: None on file  - will repeat echo.   Laboratory Data:  Chemistry Recent Labs  Lab 07/20/18 0535 07/21/18 0723 07/22/18 0558  NA 135 134* 133*  K 3.9 4.2 3.8  CL 101 102 99  CO2 24 25 25   GLUCOSE 134* 114* 120*  BUN 27* 16 14  CREATININE 1.90* 1.50* 1.52*  CALCIUM 8.8* 8.5* 9.0  GFRNONAA 34* 45* 44*  GFRAA 39* 52* 52*  ANIONGAP 10 7 9     Recent Labs  Lab 07/17/18 1444  PROT 7.7  ALBUMIN 4.4  AST 22  ALT 20  ALKPHOS 67  BILITOT 0.8   Hematology Recent Labs  Lab 07/17/18 1444 07/19/18 1422 07/21/18 0723  WBC 9.8 8.8 7.1  RBC 4.98 4.88 4.28  HGB 14.0 13.3 12.3*  HCT 44.5 42.7 36.9*  MCV 89.4 87.5 86.2  MCH 28.1 27.3 28.7  MCHC 31.5 31.1 33.3  RDW 13.7 13.8 13.6  PLT 211 187 156   Cardiac Enzymes Recent Labs  Lab 07/19/18 1430 07/20/18 1156 07/21/18 0723  TROPONINI 0.05* 0.06* 0.07*   No results for input(s): TROPIPOC in the last 168 hours.  BNPNo results for input(s): BNP, PROBNP in the last 168 hours.  DDimer No results for input(s): DDIMER in the last 168 hours.  Radiology/Studies:  Dg Lumbar Spine 2-3 Views  Result Date: 07/20/2018 CLINICAL DATA:  Frequent falls.  Pain. EXAM: LUMBAR SPINE - 2-3 VIEW COMPARISON:  March 19, 2019 FINDINGS: Contrast in the colon overlies the upper lumbar spine limiting evaluation on the frontal view. Anterior wedging of T11 is stable since July 17, 2018. No acute fractures in the lumbar spine. No malalignment. Multilevel degenerative disc disease and lower lumbar facet degenerative changes. IMPRESSION: Degenerative changes. No acute fractures or malalignment identified. Electronically Signed   By: Dorise Bullion  III M.D   On: 07/20/2018 13:37   Ct Head Wo Contrast  Result Date: 07/19/2018 CLINICAL DATA:  Syncope and falls. EXAM: CT HEAD WITHOUT CONTRAST CT CERVICAL SPINE WITHOUT CONTRAST TECHNIQUE: Multidetector CT imaging of the head and cervical spine was performed following the standard protocol without intravenous contrast. Multiplanar CT image reconstructions of the cervical spine were also generated. COMPARISON:  None. FINDINGS: CT HEAD FINDINGS Brain: No evidence of acute infarction, hemorrhage, hydrocephalus, extra-axial collection or mass lesion/mass effect. Vascular: No hyperdense vessel or unexpected calcification. Skull: Normal. Negative for fracture or focal lesion. Sinuses/Orbits: No acute finding. Other: None. CT CERVICAL SPINE FINDINGS Alignment: Normal. Skull base and vertebrae: No acute fracture. No primary bone lesion or focal pathologic process. Soft tissues and spinal canal: No prevertebral fluid or swelling. No visible canal hematoma. Disc levels: Severe degenerative disc disease is noted at C5-6 and C6-7 with anterior osteophyte formation. Upper chest: Negative. Other: Degenerative changes are seen involving posterior facet joints bilaterally. IMPRESSION: Normal  head CT. Multilevel degenerative disc disease is noted in the cervical spine. No acute abnormality is seen. Electronically Signed   By: Marijo Conception, M.D.   On: 07/19/2018 15:05   Ct Cervical Spine Wo Contrast  Result Date: 07/19/2018 CLINICAL DATA:  Syncope and falls. EXAM: CT HEAD WITHOUT CONTRAST CT CERVICAL SPINE WITHOUT CONTRAST TECHNIQUE: Multidetector CT imaging of the head and cervical spine was performed following the standard protocol without intravenous contrast. Multiplanar CT image reconstructions of the cervical spine were also generated. COMPARISON:  None. FINDINGS: CT HEAD FINDINGS Brain: No evidence of acute infarction, hemorrhage, hydrocephalus, extra-axial collection or mass lesion/mass effect. Vascular: No  hyperdense vessel or unexpected calcification. Skull: Normal. Negative for fracture or focal lesion. Sinuses/Orbits: No acute finding. Other: None. CT CERVICAL SPINE FINDINGS Alignment: Normal. Skull base and vertebrae: No acute fracture. No primary bone lesion or focal pathologic process. Soft tissues and spinal canal: No prevertebral fluid or swelling. No visible canal hematoma. Disc levels: Severe degenerative disc disease is noted at C5-6 and C6-7 with anterior osteophyte formation. Upper chest: Negative. Other: Degenerative changes are seen involving posterior facet joints bilaterally. IMPRESSION: Normal head CT. Multilevel degenerative disc disease is noted in the cervical spine. No acute abnormality is seen. Electronically Signed   By: Marijo Conception, M.D.   On: 07/19/2018 15:05   US Abdomen Complete  Result Date: 07/21/2018 CLINICAL DATA:  72 year old male with abdominal pain and distension. History of LEFT nephrectomy. EXAM: ABDOMEN ULTRASOUND COMPLETE COMPARISON:  07/17/2018 CT FINDINGS: Gallbladder: Gallbladder wall thickening is noted. There is no evidence of cholelithiasis, sonographic Murphy sign or pericholecystic fluid. Common bile duct: Diameter: 4 mm. No intrahepatic or extrahepatic biliary dilatation. Liver: No focal lesion identified. Within normal limits in parenchymal echogenicity. Portal vein is patent on color Doppler imaging with normal direction of blood flow towards the liver. IVC: No abnormality visualized. Pancreas: Visualized portion unremarkable. Spleen: Size and appearance within normal limits. Right Kidney: Length: 4.5 cm. RIGHT renal cysts are identified, the largest measuring 1.9 cm. Echogenicity within normal limits. No mass or hydronephrosis visualized. Left Kidney: Not visualized compatible with nephrectomy. Abdominal aorta: No aneurysm visualized. Other findings: None. IMPRESSION: 1. Nonspecific gallbladder wall thickening without cholelithiasis or other evidence of acute  cholecystitis. Consider nuclear medicine study if there is strong clinical suspicion for acute cholecystitis. 2. No other acute or significant abnormality. 3. Status post LEFT nephrectomy. Electronically Signed   By: Margarette Canada M.D.   On: 07/21/2018 08:23    Assessment and Plan:   Jamisen Duerson Ipock is a 73 y.o. male with a hx of CAD, h/o systolic HF w/ improvement in LVEF, h/o PVCs, HLD, BPH and recent GI complaints, scheduled for elective EGD today, who is being seen today for the evaluation of new onset atrial fibrillation,  at the request of Dr. Eliseo Squires, Internal Medicine.   1. New Onset Atrial Fibrillation w/ RVR: noted post procedure after EGD. K WNL at 3.8. Short duration. Less than 2 hrs. During my examination, he spontaneously converted back to NSR on IV cardizem. HR currently in the 70s. He was completely asymptomatic. He will be transported back to tele floor. Can continue to monitor on tele. Given his risk factors and CHA2DS2 VASc score of 4 (CHF, HTN, Age 64-74, vascular dz), we will start oral anticoagulation. This patients CHA2DS2-VASc Score and unadjusted Ischemic Stroke Rate (% per year) is equal to 4.8 % stroke rate/year from a score of 4 Will stop Plavix. Add Eliquis  5 mg BID. Will update echo. He should resume his home  blocker, Coreg. We will increased dose to 37.5 mg BID.      2. Syncope: secondary to orthostatic hypotension from decreased PO intake. Improved w/ IVFs. Home meds initially held on admit for hypotension and AKI. Also had EGD today and has been NPO. BP running high. Some of his home meds are being reordered.   3. GI: recent migratory abdominal pain, constipation, nausea and decreased PO intake. CT scan unrevealing. [place] negative. EGD today showed mild gastritis. Biopsies taken. GI has recommended PPI x 4-6 weeks.   4. HTN: was hypotensive on admit, 2/2 orthostatic hypotension.  Home meds initially held on admit for hypotension and AKI. Also had EGD today and has been NPO.  BP running high. Some of his home meds are being reordered. Scheduled to get amlodipine, coreg and tamsulosin. Irbesartan still on hold for AKI. Continue to monitor BP.    5. AKI: 2/2 dehydration from decreased PO intake. Improved w/ IVFs. SCr improving, down from 1.9>>1.52. Continue to hold home ARB.   6. Elevated Troponin: 0.05>>0.06>>0.07. Enzyme trend is not c/w ACS. Suspect demand ischemia in the setting of marked hypotension, 2/2 dehydration. SBP in the 60s on admit and w/ subsequent AKI, w/ SCr up to 1.9. denies CP. No plans for ischemic w/u at this time.   7. Hyponatremia: Na 133. Monitor.   8. CAD: He had a heart catheterization in 2005. During the heart catheterization he was found to have a ruptured plaque in his proximal left anterior descending artery. He had moderate irregularities in the other coronary arteries. He has since been treated w/ Plavix. Denies any recent anginal symptoms.   9. H/o Systolic HF: His initial ejection fraction was around 35%. With good medical therapy, his ejection fraction improved to 55%. He denies any recent s/s of CHF. Euvolemic on exam. Continue Coreg. ARB on hold given AKI. Given new atrial arhythmia, will update echo.   For questions or updates, please contact [place] Please consult [email or web address] for contact info under     Signed, Lyda Jester, PA-C  07/22/2018 11:05 AM   I have seen and examined the patient along with Lyda Jester, PA-C.  I have reviewed the chart, notes and new data.  I agree with PA's note.  Key new complaints: was completely unaware of any symptoms either at arrhythmia onset or after it;s termination. No angina or dyspnea. Key examination changes: normal CV exam. Had RVR despite carvedilol 25 mg BID restarted on 07/20/2018. Key new findings / data: EGD with low risk findings.  PLAN: Stop clopidogrel and start Eliquis 5 mg BID. Increase carvedilol dose. Outpatient arrhythmia monitor to evaluate arrhythmia  burden and adequacy of rate control. Repeat echo - will try to get it done this afternoon. Ischemia workup does not appear to be urgent and can be done as an outpatient.  Sanda Klein, MD, [place] [phone] 07/22/2018, 1:01 PM

## 2018-07-22 NOTE — Op Note (Signed)
[facility] Patient Name: Maurice Barron Procedure Date : 07/22/2018 MRN: [id number] Attending MD: Otis Brace , MD Date of Birth: 09-Nov-1945 CSN: [id number] Age: 73 Admit Type: Inpatient Procedure:                Upper GI endoscopy Indications:              Abdominal pain, Nausea with vomiting Providers:                Otis Brace, MD, Angus Seller Tinnie Gens,                            Technician Referring MD:              Medicines:                Sedation Administered by an Anesthesia Professional Complications:            No immediate complications. Estimated Blood Loss:     Estimated blood loss was minimal. Procedure:                Pre-Anesthesia Assessment:                           - Prior to the procedure, a History and Physical                            was performed, and patient medications and                            allergies were reviewed. The patient's tolerance of                            previous anesthesia was also reviewed. The risks                            and benefits of the procedure and the sedation                            options and risks were discussed with the patient.                            All questions were answered, and informed consent                            was obtained. Prior Anticoagulants: The patient                            last took Plavix (clopidogrel) 6 days prior to the                            procedure. ASA Grade Assessment: III - A patient                            with severe systemic disease. After reviewing the  risks and benefits, the patient was deemed in                            satisfactory condition to undergo the procedure.                           After obtaining informed consent, the endoscope was                            passed under direct vision. Throughout the                            procedure, the patient's blood pressure, pulse, and                         oxygen saturations were monitored continuously. The                            GIF-H190 ([id number]) Olympus gastroscope was                            introduced through the mouth, and advanced to the                            second part of duodenum. The upper GI endoscopy was                            accomplished without difficulty. The patient                            tolerated the procedure well. Scope In: Scope Out: Findings:      A non-obstructing Schatzki ring was found at the gastroesophageal       junction at 37 cm.      A small hiatal hernia was present.      One tongue of salmon-colored mucosa was present at 37 cm. The maximum       longitudinal extent of these esophageal mucosal changes was 0.5 cm in       length. Biopsies were taken with a cold forceps for histology.      Scattered mild inflammation characterized by congestion (edema) and       erythema was found in the prepyloric region of the stomach. Biopsies       were taken with a cold forceps for histology.      The cardia and gastric fundus were normal on retroflexion.      The duodenal bulb, first portion of the duodenum and second portion of       the duodenum were normal. Biopsies were taken with a cold forceps for       histology. Impression:               - Non-obstructing Schatzki ring.                           - Small hiatal hernia.                           -  Salmon-colored mucosa suspicious for Barrett's                            esophagus. Biopsied.                           - Gastritis. Biopsied.                           - Normal duodenal bulb, first portion of the                            duodenum and second portion of the duodenum.                            Biopsied. Recommendation:           - Return patient to hospital ward for ongoing care.                           - Soft diet.                           - Continue present medications.                           -  Await pathology results.                           - Return to GI office in 4 weeks. Procedure Code(s):        --- Professional ---                           [id number], Esophagogastroduodenoscopy, flexible,                            transoral; with biopsy, single or multiple Diagnosis Code(s):        --- Professional ---                           K22.2, Esophageal obstruction                           K22.8, Other specified diseases of esophagus                           K29.70, Gastritis, unspecified, without bleeding                           R10.9, Unspecified abdominal pain                           R11.2, Nausea with vomiting, unspecified CPT copyright 2018 American Medical Association. All rights reserved. The codes documented in this report are preliminary and upon coder review may  be revised to meet current compliance requirements. Otis Brace, MD Otis Brace, MD 07/22/2018 10:17:52 AM Number of Addenda: 0

## 2018-07-22 NOTE — Progress Notes (Addendum)
Patient with Orthostatic vital signs as ordered Lying 102/49 heart rate 73 Sitting- 82/50 heart rate 81 Standing- 71/50 heart rate 87 Standing at 3 minutes 98/56 heart rate 87  Patient when standing symptomatic. Patient states he is dizzy and "didn't feel like this earlier today". Pt required reminders to hold chin up and not look down patient also swaying from side to side. And front to back. Did not ambulate in hallway at this time due to symptoms assisted back to bed and bed alarm on. bp when lying back down 111/53 heart rate 77 Deward Sebek, [employer] rN

## 2018-07-22 NOTE — Progress Notes (Signed)
Patient transferred to 4E12, receiving RN at bedside, family updated on patients room assignment, no questions or concerns from patient or receiving RN. Patient placed on tele, VS stable, call bell in reach.  Rowe Pavy, RN

## 2018-07-22 NOTE — Progress Notes (Addendum)
[facility] PROGRESS NOTE  Maurice Barron [facility] IFO:[id number] DOB: September 01, 1945 DOA: 07/19/2018 PCP: [name], MD  Admitted 3/13 with aki and orthostatic hypotension and abdominal pain. Evaluated by gi who recommended slow bowel prep.  S/p EGD where in recovery he went into a fib with RVR.  Once back on unit- converted into sinus but was again orthostatic  Assessment/Plan:  A fib with RVR -converted back to sinus -on BB--- dose increased -eliquis  Left sided infiltrate ?aspiration with EGD -formal read pending by radiology -start doxycycline IV and monitor overnight  Acute kidney injury: likely related to dehydration secondary to decreased po intake and 3 new meds (bentyl, tramadol and flexaril) in setting of having only one kidney. continues to improve. Creatinine trending downward.  -recheck in AM  Orthostatic hypotension.  -improved initially but then re-occurred after bowel clean out so suspect related to low volume -no sign of parkinson, has not been on steroids (was orthostatic initially not on flomax) -IVF bolus   Hyperkalemia: Likely related to acute kidney injury. Patient received Lokelma, insulin, and dextrose in the emergency department. Potassium trending up today but remains within limits of normal.  -holding ARB  Hyponatremia: mild. Likely related to dehydration and volume.   -recheck in AM  History of chronic systolic congestive heart failure. Remains well compensated. Weight stable. Chart review indicates cards visit 2017 where it was noted that initial EF 35% but with good medical therapy EF now 60%.  -continue BB  Elevated troponin -suspect demand -outpatient work up with cardiology  Abdominal discomfort -initially was concerning for prostatitis (enlarged prostate- new) vs constipation -pain much improved after bowel clean out so abx d/c'd -EGD done: mild gastritis.  Small tongue of salmon-colored mucosa GE junction.  Biopsies performed:  recommend PPI BID  Enlarged prostate -flomax started and patient has tolerated over last few days -does have PVR so will need urology follow up  Code Status: full Family Communication: spoke with daughter on phone Disposition Plan: home hopefully tomorrow   Consultants:  [place] [facility]  Procedures: EGD    HPI/Subjective: Abdominal pain resolved but now orthostatic again  Objective: Vitals:   07/22/18 1551 07/22/18 1605  BP: (!) 71/50 (!) 111/53  Pulse: 87 74  Resp: (!) 24 20  Temp:    SpO2:      Intake/Output Summary (Last 24 hours) at 07/22/2018 1657 Last data filed at 07/22/2018 1200 Gross per 24 hour  Intake 1200 ml  Output 375 ml  Net 825 ml   Filed Weights   07/19/18 1329 07/19/18 1800 07/22/18 0833  Weight: 81.2 kg 81.8 kg 81.8 kg    Exam:   In bed, comfortable  rrr  Crackles at bases left >right  +BS, soft  No LE edema   Data Reviewed: Basic Metabolic Panel: Recent Labs  Lab 07/19/18 1422 07/19/18 2004 07/20/18 0535 07/21/18 0723 07/22/18 0558  NA 131* 135 135 134* 133*  K 6.1* 4.0 3.9 4.2 3.8  CL 97* 102 101 102 99  CO2 20* 24 24 25 25   GLUCOSE 99 89 134* 114* 120*  BUN 27* 25* 27* 16 14  CREATININE 2.13* 2.17* 1.90* 1.50* 1.52*  CALCIUM 8.9 8.8* 8.8* 8.5* 9.0   Liver Function Tests: Recent Labs  Lab 07/17/18 1444  AST 22  ALT 20  ALKPHOS 67  BILITOT 0.8  PROT 7.7  ALBUMIN 4.4   Recent Labs  Lab 07/17/18 1444  LIPASE 43   No results for input(s): AMMONIA in the last 168  hours. CBC: Recent Labs  Lab 07/17/18 1444 07/19/18 1422 07/21/18 0723  WBC 9.8 8.8 7.1  NEUTROABS  --  6.6  --   HGB 14.0 13.3 12.3*  HCT 44.5 42.7 36.9*  MCV 89.4 87.5 86.2  PLT 211 187 156   Cardiac Enzymes: Recent Labs  Lab 07/19/18 1430 07/20/18 1156 07/21/18 0723  TROPONINI 0.05* 0.06* 0.07*   BNP (last 3 results) No results for input(s): BNP in the last 8760 hours.  ProBNP (last 3 results) No results for  input(s): PROBNP in the last 8760 hours.  CBG: No results for input(s): GLUCAP in the last 168 hours.  Recent Results (from the past 240 hour(s))  Culture, Urine     Status: None   Collection Time: 07/20/18 11:37 AM  Result Value Ref Range Status   Specimen Description URINE, RANDOM  Final   Special Requests NONE  Final   Culture   Final    NO GROWTH Performed at [facility], [address] [place], [place] [address]    Report Status 07/21/2018 FINAL  Final     Studies: US Abdomen Complete  Result Date: 07/21/2018 CLINICAL DATA:  73 year old male with abdominal pain and distension. History of LEFT nephrectomy. EXAM: ABDOMEN ULTRASOUND COMPLETE COMPARISON:  07/17/2018 CT FINDINGS: Gallbladder: Gallbladder wall thickening is noted. There is no evidence of cholelithiasis, sonographic Murphy sign or pericholecystic fluid. Common bile duct: Diameter: 4 mm. No intrahepatic or extrahepatic biliary dilatation. Liver: No focal lesion identified. Within normal limits in parenchymal echogenicity. Portal vein is patent on color Doppler imaging with normal direction of blood flow towards the liver. IVC: No abnormality visualized. Pancreas: Visualized portion unremarkable. Spleen: Size and appearance within normal limits. Right Kidney: Length: 4.5 cm. RIGHT renal cysts are identified, the largest measuring 1.9 cm. Echogenicity within normal limits. No mass or hydronephrosis visualized. Left Kidney: Not visualized compatible with nephrectomy. Abdominal aorta: No aneurysm visualized. Other findings: None. IMPRESSION: 1. Nonspecific gallbladder wall thickening without cholelithiasis or other evidence of acute cholecystitis. Consider nuclear medicine study if there is strong clinical suspicion for acute cholecystitis. 2. No other acute or significant abnormality. 3. Status post LEFT nephrectomy. Electronically Signed   By: Margarette Canada M.D.   On: 07/21/2018 08:23    Scheduled Meds: . apixaban  5 mg  Oral BID  . carvedilol  37.5 mg Oral BID WC  . docusate sodium  100 mg Oral Daily  . pantoprazole  40 mg Oral BID AC  . sodium chloride flush  3 mL Intravenous Q12H  . tamsulosin  0.4 mg Oral QPC supper   Continuous Infusions: . ciprofloxacin    . sodium chloride      Principal Problem:   AKI (acute kidney injury) ([place]) Active Problems:   Systolic CHF (HCC)   Orthostatic hypotension   Hyperkalemia   Hyponatremia   Near syncope   Abdominal pain    Time spent: 35 minutes    Eulogio Bear DO [facility]  If 7PM-7AM, please contact night-coverage at [email or web address], password [facility] 07/22/2018, 4:57 PM  LOS: 0 days

## 2018-07-22 NOTE — Transfer of Care (Signed)
Immediate Anesthesia Transfer of Care Note  Patient: Maurice Barron  Procedure(s) Performed: ESOPHAGOGASTRODUODENOSCOPY (EGD) WITH PROPOFOL (Left ) BIOPSY  Patient Location: PACU  Anesthesia Type:MAC  Level of Consciousness: awake, alert , oriented and patient cooperative  Airway & Oxygen Therapy: Patient Spontanous Breathing and Patient connected to nasal cannula oxygen  Post-op Assessment: Report given to RN and Post -op Vital signs reviewed and stable  Post vital signs: Reviewed and stable  Last Vitals:  Vitals Value Taken Time  BP    Temp    Pulse    Resp    SpO2      Last Pain:  Vitals:   07/22/18 0833  TempSrc: Oral  PainSc: 0-No pain      Patients Stated Pain Goal: 0 (31/49/70 2637)  Complications: No apparent anesthesia complications

## 2018-07-22 NOTE — TOC Benefit Eligibility Note (Signed)
Transition of Care [facility]) Benefit Eligibility Note    Patient Details  Name: Maurice Barron [facility] MRN: [id number] Date of Birth: [birth year]   Medication/Dose: Eliquis 5mg  BID  Covered?: Yes     Prescription Coverage [facility] [address] [address]  Spoke with Person/Company/Phone Number:: [employer]/ [phone]  Co-Pay: $45.00  Prior Approval: No     Additional Notes: Co-pay will be $47 at other pharmacies    Maurice Barron Phone Number: 07/22/2018, 2:01 PM

## 2018-07-22 NOTE — Progress Notes (Signed)
Patient given incentive spirometer and instructed on use. Shelbia Scinto, Bettina Gavia rN

## 2018-07-22 NOTE — Progress Notes (Signed)
Diltiazem stopped one hour after Coreg given. Upon blood pressure check automatic bp 84/27  Manual blood pressure 102/26 in right arm. oxygen on room air is 86-88 percent patient placed back on 2L [place] at oxygen left came up to 92-93 percent. Patient denies any dizziness at this time. Will continue to monitor patient. Jenniger Figiel, Bettina Gavia rN

## 2018-07-22 NOTE — Anesthesia Procedure Notes (Signed)
Procedure Name: MAC Date/Time: 07/22/2018 9:55 AM Performed by: Kathryne Hitch, CRNA Pre-anesthesia Checklist: Patient identified, Emergency Drugs available, Suction available, Patient being monitored and Timeout performed Patient Re-evaluated:Patient Re-evaluated prior to induction Oxygen Delivery Method: Nasal cannula Preoxygenation: Pre-oxygenation with 100% oxygen Induction Type: IV induction Dental Injury: Teeth and Oropharynx as per pre-operative assessment

## 2018-07-22 NOTE — TOC Initial Note (Signed)
Transition of Care [facility]) - Initial/Assessment Note    Patient Details  Name: Maurice Barron [facility] MRN: [id number] Date of Birth: Nov 17, 1945  Transition of Care [facility]) CM/SW Contact:    Bartholomew Crews, RN Phone Number: 07/22/2018, 5:36 PM  Clinical Narrative:                 Received call from the nurse that patient was to be starting on Eliquis and benefits check was requested. Benefits check completed by CMA-monthly copay $45/month-patient made aware. Discussed using TOC pharmacy at discharge and will use 30 day free card for new patient-patient agreeable, MD and [facility] pharmacy notified. Spoke with patient, spouse, and daughter at bedside. No HH/DME needs identified. Patient states that he has been up walking in the halls several times. Says he has a cpap at home which he brought to the hospital, however, he hasn't been sleeping much, so he hasn't used it as usual. Patient states that he is sending his cpap home with family, and that he is hoping to transition home today or at latest tomorrow. Spouse to provide transportation home and for medical appointments if needed. Usual pharmacy is [facility] [place].  No other transition of care needs identified.   Expected Discharge Plan: Home/Self Care Barriers to Discharge: No Barriers Identified   Patient Goals and CMS Choice Patient states their goals for this hospitalization and ongoing recovery are:: get home   Choice offered to / list presented to : NA  Expected Discharge Plan and Services Expected Discharge Plan: Home/Self Care Discharge Planning Services: CM Consult Post Acute Care Choice: NA Living arrangements for the past 2 months: Single Family Home Expected Discharge Date: 07/20/18               DME Arranged: N/A DME Agency: NA HH Arranged: NA HH Agency: NA  Prior Living Arrangements/Services Living arrangements for the past 2 months: Single Family Home Lives with:: Self, Spouse Patient language and need for interpreter  reviewed:: No Do you feel safe going back to the place where you live?: Yes      Need for Family Participation in Patient Care: Yes (Comment) Care giver support system in place?: Yes (comment)   Criminal Activity/Legal Involvement Pertinent to Current Situation/Hospitalization: No - Comment as needed  Activities of Daily Living Home Assistive Devices/Equipment: None ADL Screening (condition at time of admission) Patient's cognitive ability adequate to safely complete daily activities?: Yes Is the patient deaf or have difficulty hearing?: No Does the patient have difficulty seeing, even when wearing glasses/contacts?: No Does the patient have difficulty concentrating, remembering, or making decisions?: No Patient able to express need for assistance with ADLs?: Yes Does the patient have difficulty dressing or bathing?: No Independently performs ADLs?: Yes (appropriate for developmental age) Does the patient have difficulty walking or climbing stairs?: No Weakness of Legs: None Weakness of Arms/Hands: None  Permission Sought/Granted Permission sought to share information with : Case Manager, Family Supports Permission granted to share information with : Yes, Verbal Permission Granted              Emotional Assessment Appearance:: Appears stated age Attitude/Demeanor/Rapport: Engaged Affect (typically observed): Happy, Accepting Orientation: : Oriented to Self, Oriented to Place, Oriented to Situation, Oriented to  Time Alcohol / Substance Use: Not Applicable Psych Involvement: No (comment)  Admission diagnosis:  Hyperkalemia [E87.5] Troponin level elevated [R79.89] AKI (acute kidney injury) ([place]) [N17.9] Patient Active Problem List   Diagnosis Date Noted  . Abdominal pain 07/20/2018  .  Orthostatic hypotension 07/19/2018  . AKI (acute kidney injury) ([place]) 07/19/2018  . Hyperkalemia 07/19/2018  . Hyponatremia 07/19/2018  . Near syncope 07/19/2018  . Systolic CHF ([place])  87/21/5872  . Congestive heart failure ([place]) 12/01/2010  . Hyperlipidemia 12/01/2010   PCP:  [name], MD [facility] [address] [place] [address] Phone: [phone] Fax: [phone]  [facility] [place] [place] - [address] [place] [address] Phone: [phone] Fax: [phone]     Social Determinants of Health (SDOH) Interventions    Readmission Risk Interventions  No flowsheet data found.

## 2018-07-22 NOTE — Interval H&P Note (Signed)
History and Physical Interval Note:  07/22/2018 9:23 AM  Maurice Barron  has presented today for surgery, with the diagnosis of abdominal pain, nausea.  The various methods of treatment have been discussed with the patient and family. After consideration of risks, benefits and other options for treatment, the patient has consented to  Procedure(s): ESOPHAGOGASTRODUODENOSCOPY (EGD) WITH PROPOFOL (Left) as a surgical intervention.  The patient's history has been reviewed, patient examined, no change in status, stable for surgery.  I have reviewed the patient's chart and labs.  Questions were answered to the patient's satisfaction.    His abdominal pain is improving.  Denied any further nausea and vomiting.  Risks (bleeding, infection, bowel perforation that could require surgery, sedation-related changes in cardiopulmonary systems), benefits (identification and possible treatment of source of symptoms, exclusion of certain causes of symptoms), and alternatives (watchful waiting, radiographic imaging studies, empiric medical treatment)  were explained to patient/family in detail and patient wishes to proceed.  Maurice Barron

## 2018-07-22 NOTE — Progress Notes (Signed)
Patient arrived from PACU to 4e12 vital signs obtained and CCMD made aware. Patient anxious to be discharged today. Will monitor patient. Latori Beggs, Bettina Gavia rN

## 2018-07-22 NOTE — Progress Notes (Signed)
Called endoscopy. He is NPO but can have sips with meds.

## 2018-07-22 NOTE — Brief Op Note (Signed)
07/19/2018 - 07/22/2018  10:42 AM  PATIENT:  Maurice Barron  72 y.o. male  PRE-OPERATIVE DIAGNOSIS:  abdominal pain, nausea  POST-OPERATIVE DIAGNOSIS:  biopsies in duodenum r/o celiac's, gastric biopsies r/o h.pylori, esophagus biopsies r/o Barrett's  PROCEDURE:  Procedure(s): ESOPHAGOGASTRODUODENOSCOPY (EGD) WITH PROPOFOL (Left) BIOPSY  SURGEON:  Surgeon(s) and Role:    * Ailin Rochford, MD - Primary   Findings --------- -EGD showed mild gastritis.  Small tongue of salmon-colored mucosa GE junction.  Biopsies performed.  Recommendations ------------------------- -Patient developed postop atrial fibrillation.  He is awake.  Alert and oriented x3.  Blood pressure stable.  Cardiology has been consulted.  Family updated.  -Discharge planning pending cardiology consultation. -ok to  discharge from GI standpoint on PPI once a day. -Follow-up in GI clinic in 4 to 6 weeks after discharge.  Otis Brace MD, [place] 07/22/2018, 10:45 AM  Contact #  [phone]

## 2018-07-22 NOTE — Progress Notes (Signed)
Respiratory made aware of order for flutter valve. Niketa Turner, Bettina Gavia rN

## 2018-07-23 ENCOUNTER — Encounter (HOSPITAL_COMMUNITY): Payer: Self-pay | Admitting: Gastroenterology

## 2018-07-23 LAB — BASIC METABOLIC PANEL
Anion gap: 12 (ref 5–15)
BUN: 17 mg/dL (ref 8–23)
CO2: 23 mmol/L (ref 22–32)
Calcium: 8.5 mg/dL — ABNORMAL LOW (ref 8.9–10.3)
Chloride: 101 mmol/L (ref 98–111)
Creatinine, Ser: 1.63 mg/dL — ABNORMAL HIGH (ref 0.61–1.24)
GFR calc Af Amer: 47 mL/min — ABNORMAL LOW (ref >60)
GFR calc non Af Amer: 41 mL/min — ABNORMAL LOW (ref >60)
Glucose, Bld: 113 mg/dL — ABNORMAL HIGH (ref 70–99)
Potassium: 4 mmol/L (ref 3.5–5.1)
Sodium: 136 mmol/L (ref 135–145)

## 2018-07-23 LAB — CBC
HCT: 34.6 % — ABNORMAL LOW (ref 39.0–52.0)
Hemoglobin: 11.2 g/dL — ABNORMAL LOW (ref 13.0–17.0)
MCH: 27.7 pg (ref 26.0–34.0)
MCHC: 32.4 g/dL (ref 30.0–36.0)
MCV: 85.4 fL (ref 80.0–100.0)
Platelets: 167 10*3/uL (ref 150–400)
RBC: 4.05 MIL/uL — ABNORMAL LOW (ref 4.22–5.81)
RDW: 14 % (ref 11.5–15.5)
WBC: 10.6 10*3/uL — ABNORMAL HIGH (ref 4.0–10.5)
nRBC: 0 % (ref 0.0–0.2)

## 2018-07-23 LAB — H. PYLORI ANTIBODY, IGG: H Pylori IgG: 0.17 {index_val} (ref 0.00–0.79)

## 2018-07-23 MED ORDER — TAMSULOSIN HCL 0.4 MG PO CAPS: 0.4000 mg | ORAL_CAPSULE | Freq: Every day | ORAL | 0 refills | Status: DC

## 2018-07-23 MED ORDER — DOXYCYCLINE HYCLATE 100 MG PO CAPS: 100.0000 mg | ORAL_CAPSULE | Freq: Two times a day (BID) | ORAL | 0 refills | Status: DC

## 2018-07-23 MED ORDER — POLYETHYLENE GLYCOL 3350 17 G PO PACK: 17.0000 g | PACK | Freq: Every day | ORAL | 0 refills | Status: DC | PRN

## 2018-07-23 MED ORDER — DOCUSATE SODIUM 100 MG PO CAPS: 100.0000 mg | ORAL_CAPSULE | Freq: Every day | ORAL | 0 refills | Status: DC

## 2018-07-23 MED ORDER — CARVEDILOL 25 MG PO TABS: 37.5000 mg | ORAL_TABLET | Freq: Two times a day (BID) | ORAL | 0 refills | Status: DC

## 2018-07-23 NOTE — Progress Notes (Signed)
Progress Note  Patient Name: Mickael Mcnutt Hirschmann Date of Encounter: 07/23/2018  Primary Cardiologist: Mertie Moores, MD   Subjective   Feels well. Light cough (chronic). No dyspnea. No additional arrhythmia on monitor.  Inpatient Medications    Scheduled Meds: . apixaban  5 mg Oral BID  . carvedilol  37.5 mg Oral BID WC  . docusate sodium  100 mg Oral Daily  . pantoprazole  40 mg Oral BID AC  . sodium chloride flush  3 mL Intravenous Q12H  . tamsulosin  0.4 mg Oral QPC supper   Continuous Infusions: . doxycycline (VIBRAMYCIN) IV 100 mg (07/23/18 0617)   PRN Meds: acetaminophen **OR** acetaminophen, hydrALAZINE, ondansetron **OR** ondansetron (ZOFRAN) IV, polyethylene glycol   Vital Signs    Vitals:   07/22/18 1819 07/22/18 2035 07/23/18 0303 07/23/18 0800  BP: 116/62 (!) 89/50 (!) 141/70 (!) 151/68  Pulse: 82  64 77  Resp: (!) 24  17 (!) 27  Temp:  98.4 F (36.9 C) 98.6 F (37 C) 98.8 F (37.1 C)  TempSrc:  Oral Oral Oral  SpO2: 91%  95% 96%  Weight:      Height:        Intake/Output Summary (Last 24 hours) at 07/23/2018 0939 Last data filed at 07/23/2018 0617 Gross per 24 hour  Intake 1700 ml  Output 1025 ml  Net 675 ml   Last 3 Weights 07/22/2018 07/19/2018 07/19/2018  Weight (lbs) 180 lb 5.4 oz 180 lb 5.4 oz 179 lb  Weight (kg) 81.8 kg 81.8 kg 81.194 kg      Telemetry    NSR - Personally Reviewed  ECG    No new ecg - Personally Reviewed  Physical Exam  Appears well GEN: No acute distress.   Neck: No JVD Cardiac: RRR, no murmurs, rubs, or gallops.  Respiratory: Clear to auscultation bilaterally. GI: Soft, nontender, non-distended  MS: No edema; No deformity. Neuro:  Nonfocal  Psych: Normal affect   Labs    Chemistry Recent Labs  Lab 07/17/18 1444  07/21/18 0723 07/22/18 0558 07/23/18 0252  NA 134*   < > 134* 133* 136  K 4.2   < > 4.2 3.8 4.0  CL 99   < > 102 99 101  CO2 27   < > 25 25 23   GLUCOSE 108*   < > 114* 120* 113*  BUN 19    < > 16 14 17   CREATININE 1.41*   < > 1.50* 1.52* 1.63*  CALCIUM 9.6   < > 8.5* 9.0 8.5*  PROT 7.7  --   --   --   --   ALBUMIN 4.4  --   --   --   --   AST 22  --   --   --   --   ALT 20  --   --   --   --   ALKPHOS 67  --   --   --   --   BILITOT 0.8  --   --   --   --   GFRNONAA 49*   < > 45* 44* 41*  GFRAA 56*   < > 52* 52* 47*  ANIONGAP 8   < > 7 9 12    < > = values in this interval not displayed.     Hematology Recent Labs  Lab 07/19/18 1422 07/21/18 0723 07/23/18 0252  WBC 8.8 7.1 10.6*  RBC 4.88 4.28 4.05*  HGB 13.3 12.3* 11.2*  HCT  42.7 36.9* 34.6*  MCV 87.5 86.2 85.4  MCH 27.3 28.7 27.7  MCHC 31.1 33.3 32.4  RDW 13.8 13.6 14.0  PLT 187 156 167    Cardiac Enzymes Recent Labs  Lab 07/19/18 1430 07/20/18 1156 07/21/18 0723  TROPONINI 0.05* 0.06* 0.07*   No results for input(s): TROPIPOC in the last 168 hours.   BNPNo results for input(s): BNP, PROBNP in the last 168 hours.   DDimer No results for input(s): DDIMER in the last 168 hours.   Radiology    Dg Chest Port 1 View  Result Date: 07/22/2018 CLINICAL DATA:  Hypoxia. EXAM: PORTABLE CHEST 1 VIEW COMPARISON:  Status post endoscopy. FINDINGS: Normal heart size. Asymmetric opacity in the LEFT mid and lower lung zones, concerning for infiltrate; atelectasis is less favored. No effusion. Healed rib fractures on the RIGHT. Aortic atherosclerosis. IMPRESSION: Asymmetric opacity in the LEFT mid and lower lung zones concerning for infiltrate, query aspiration. Continued surveillance is warranted. Electronically Signed   By: Staci Righter M.D.   On: 07/22/2018 17:30    Cardiac Studies   ECHO 07/23/2018   1. The left ventricle has normal systolic function with an ejection fraction of 60-65%. The cavity size was normal. Left ventricular diastolic Doppler parameters are consistent with impaired relaxation. No evidence of left ventricular regional wall  motion abnormalities.  2. The right ventricle has normal  systolic function. The cavity was normal. There is no increase in right ventricular wall thickness. Right ventricular systolic pressure could not be assessed.  3. Left atrial size was mildly dilated.  Patient Profile     73 y.o. male with incidentally diagnosed atrial fibrillation with RVR during endoscopy, dilated left atrium on echo without other major structural abnormalities.  Assessment & Plan    DC home today on Eliquis 5 mg BID and carvedilol 37.5 mg BID (will need a new Rx for carvedilol at DC). [facility] will sign off.   Medication Recommendations:  Eliquis 5 mg BID and carvedilol 37.5 mg BID  Other recommendations (labs, testing, etc):  monitor scheduled 08/07/18 Follow up as an outpatient:  Office appt scheduled 09/05/18  For questions or updates, please contact [place] Please consult [email or web address] for contact info under        Signed, Sanda Klein, MD  07/23/2018, 9:39 AM

## 2018-07-23 NOTE — Discharge Instructions (Addendum)

## 2018-07-23 NOTE — Progress Notes (Signed)
[facility] Gastroenterology Progress Note  Maurice Barron 72 y.o. 02/01/1946  CC: Abdominal pain, nausea and vomiting.   Subjective: Denies abdominal pain.  No further nausea or vomiting. .  Denies black stool or blood in the stool.  Overall feeling better  ROS : Denies chest pain or palpitation.  Denies shortness of breath.   Objective: Vital signs in last 24 hours: Vitals:   07/23/18 0800 07/23/18 1000  BP: (!) 151/68   Pulse: 77   Resp: (!) 27 (!) 25  Temp: 98.8 F (37.1 C)   SpO2: 96%     Physical Exam:  General.  Alert/oriented x3.  Not in acute distress Heart.  Rate rhythm regular. ABD :  Soft, nontender, nondistended, bowel sounds present.  No peritoneal signs LE : no edema    Lab Results: Recent Labs    07/22/18 0558 07/23/18 0252  NA 133* 136  K 3.8 4.0  CL 99 101  CO2 25 23  GLUCOSE 120* 113*  BUN 14 17  CREATININE 1.52* 1.63*  CALCIUM 9.0 8.5*   No results for input(s): AST, ALT, ALKPHOS, BILITOT, PROT, ALBUMIN in the last 72 hours. Recent Labs    07/21/18 0723 07/23/18 0252  WBC 7.1 10.6*  HGB 12.3* 11.2*  HCT 36.9* 34.6*  MCV 86.2 85.4  PLT 156 167   No results for input(s): LABPROT, INR in the last 72 hours.    Assessment/Plan: -Abdominal pain, nausea and vomiting.  Resolved.  Could be from underlying constipation.  EGD yesterday showed mild gastritis.  Biopsies pending - post op  A. fib.  Resolved.  Plavix has been changed to Eliquis. -Intermittent dysphasia.  EGD yesterday showed nonobstructive lower esophageal ring.  Recommendations ----------------------- -H. pylori serology negative. -No further inpatient GI work-up planned. -Continue pantoprazole 40 mg once a day for next 2 months -Use MiraLAX as needed. - Chew food well and moist food before swallowing.  -Need for repeat chest x-ray as an outpatient in 4 weeks  discussed with the patient and patient's family. -Follow-up in GI clinic in 3 months. -GI will sign off.  Call us  back if needed   Otis Brace MD, [place] 07/23/2018, 10:39 AM  Contact #  [phone]

## 2018-07-23 NOTE — Progress Notes (Signed)
Patient's O2 sat on room air prior to ambulation was 94% , during ambulation patient maintained an O2 sat greater than 94%. Ambulation well tolerated. Patient denied, SOB or dizziness before and during ambulation. Patient back to recliner call bell within reach will continue to monitor.

## 2018-07-23 NOTE — Progress Notes (Signed)
Patient in a stable condition, discharge education reviewed with patient and his family at bedside, he verbalized understanding, iv removed, tele dc ccmd notified, patient belongings at bedside, patient to be transported hoome by his family

## 2018-07-24 NOTE — Anesthesia Postprocedure Evaluation (Signed)
Anesthesia Post Note  Patient: Maurice Barron  Procedure(s) Performed: ESOPHAGOGASTRODUODENOSCOPY (EGD) WITH PROPOFOL (Left ) BIOPSY     Patient location during evaluation: PACU Anesthesia Type: MAC Level of consciousness: awake and alert Pain management: pain level controlled Vital Signs Assessment: post-procedure vital signs reviewed and stable Respiratory status: spontaneous breathing, nonlabored ventilation, respiratory function stable and patient connected to nasal cannula oxygen Cardiovascular status: stable and blood pressure returned to baseline Postop Assessment: no apparent nausea or vomiting Anesthetic complications: yes Anesthetic complication details: anesthesia complicationsComments: Please see intraoperative notes. Pt developed afib with RVR at the end of the procedure. Treated as per intraoperative record. Cardiology consulted, and pt admitted to their service.     Last Vitals:  Vitals:   07/23/18 0900 07/23/18 1000  BP:    Pulse:    Resp: (!) 26 (!) 25  Temp:    SpO2:      Last Pain:  Vitals:   07/23/18 0800  TempSrc: Oral  PainSc: 0-No pain   Pain Goal: Patients Stated Pain Goal: 0 (07/21/18 0900)                 Sandeep Radell L Tanish Prien

## 2018-07-25 ENCOUNTER — Other Ambulatory Visit: Payer: Self-pay | Admitting: Gastroenterology

## 2018-07-25 ENCOUNTER — Ambulatory Visit
Admission: RE | Admit: 2018-07-25 | Discharge: 2018-07-25 | Disposition: A | Discharge status: Home / Self Care | Payer: Medicare HMO | Source: Ambulatory Visit | Attending: Gastroenterology | Admitting: Gastroenterology

## 2018-07-25 DIAGNOSIS — R110 Nausea: Secondary | ICD-10-CM

## 2018-07-25 DIAGNOSIS — R109 Unspecified abdominal pain: Secondary | ICD-10-CM | POA: Diagnosis not present

## 2018-07-26 DIAGNOSIS — R109 Unspecified abdominal pain: Secondary | ICD-10-CM | POA: Diagnosis not present

## 2018-07-26 DIAGNOSIS — R55 Syncope and collapse: Secondary | ICD-10-CM | POA: Diagnosis not present

## 2018-07-26 DIAGNOSIS — I509 Heart failure, unspecified: Secondary | ICD-10-CM | POA: Diagnosis not present

## 2018-07-26 DIAGNOSIS — N179 Acute kidney failure, unspecified: Secondary | ICD-10-CM | POA: Diagnosis not present

## 2018-07-26 DIAGNOSIS — R3121 Asymptomatic microscopic hematuria: Secondary | ICD-10-CM | POA: Diagnosis not present

## 2018-07-26 DIAGNOSIS — R350 Frequency of micturition: Secondary | ICD-10-CM | POA: Diagnosis not present

## 2018-07-26 DIAGNOSIS — E875 Hyperkalemia: Secondary | ICD-10-CM | POA: Diagnosis not present

## 2018-07-26 DIAGNOSIS — J189 Pneumonia, unspecified organism: Secondary | ICD-10-CM | POA: Diagnosis not present

## 2018-07-26 DIAGNOSIS — E871 Hypo-osmolality and hyponatremia: Secondary | ICD-10-CM | POA: Diagnosis not present

## 2018-07-29 NOTE — Discharge Summary (Signed)
Physician Discharge Summary  [name] [facility] EXH:[id number] DOB: 1946/03/20 DOA: 07/19/2018  PCP: [name], MD  Admit date: 07/19/2018 Discharge date: 3/172020  Admitted From: home Discharge disposition: home   Recommendations for Outpatient Follow-Up:   1. Ambulatory referral to urology: new med: flomax 2. Monitor blood pressure 3. protonix 40mg  BID x 2 months 4.  repeat chest x-ray as an outpatient in 4 weeks  discussed with the patient and patient's family. New med: eliquis and coreg increased BMP 1 week Bowel regimen   Discharge Diagnosis:   Principal Problem:   AKI (acute kidney injury) ([place]) Active Problems:   Systolic CHF ([place] )   Orthostatic hypotension   Hyperkalemia   Hyponatremia   Near syncope   Abdominal pain    Discharge Condition: Improved.  Diet recommendation: Low sodium, heart healthy.  Carbohydrate-modified.   Wound care: None.  Code status: Full.   History of Present Illness:   Maurice Barron is a 73 y.o. male with medical history significant of systolic congestive heart failure, coronary artery disease, 1 kidney, enlarged prostate, diverticulosis, hyperlipidemia who is to the emergency department brought in by [facility] due to increased weakness, multiple falls and a near syncopal episode at the doctor's office.  EMS arrived he was hypotensive with a blood pressure of 68/52.  It went up to 99/60 3:03 100 mL of normal saline.  Patient denies any pain and states that his been taking medication due to abdominal pain he has had for 4 days.  Had a poor p.o. intake this week.  He was seen at the emergency department on 11 March feeling like he needed to have a bowel movement.  Had problems with constipation for 2 days prior to presentation.  A CT scan was obtained and was markable only for scattered diverticulosis but no evidence of reticulitis.  He did have an enlarged prostate.  Patient was prescribed Bentyl for his abdominal  discomfort went to see his PCP again yesterday due to continued pain was given 50 mg of tramadol and 5 mg of Flexeril.  He took Bentyl Flexeril and tramadol last night around 8 PM felt well and his pain had improved when he woke up this morning however he felt lightheaded and dizzy.  Stated he was stumbling about.  Went to the PCPs office had an episode of syncope there was noted to be hypotensive felt to have orthostasis because his pressure was so low upon standing was referred here for further evaluation and management.  On the 11th his creatinine was 1.41 and today it is elevated at 2.13.  He is also slightly hyponatremic.  His potassium is elevated at 6.1 as well.  Is a mildly elevated troponin as well.  Is referred to me for further evaluation and management of orthostatic hypotension with acute kidney injury   Hospital Course by Problem:   A fib with RVR -converted back to sinus -on BB--- dose increased -eliquis  Left sided infiltrate ?aspiration with EGD -formal read pending by radiology -started doxycycline IV and monitor overnight -finish course PO  Acute kidney injury:likely related to dehydration secondary to decreased po intake and 3 new meds (bentyl, tramadol and flexaril) in setting of having only one kidney. -outpatient repeat  Orthostatic hypotension.  -improved initially but then re-occurred after bowel clean out so suspect related to low volume -no sign of parkinson, has not been on steroids (was orthostatic initially not on flomax)  Hyperkalemia: Likely related to acute kidney  injury. Patient received Lokelma, insulin, and dextrose in the emergency department. Potassium trending up today but remains within limits of normal.  -holding ARB  Hyponatremia: mild. Likely related to dehydration and volume.    History ofchronicsystolic congestive heart failure. Remainswell compensated. Weight stable.Chart review indicates cards visit 2017 where it was noted that  initial EF 35% but with good medical therapy EF now 60%.  -continue BB  Elevated troponin -suspect demand -outpatient work up with cardiology  Abdominal discomfort -initially was concerning for prostatitis (enlarged prostate- new) vs constipation -pain much improved after bowel clean out so abx d/c'd -EGD done: mild gastritis. Small tongue of salmon-colored mucosa GE junction. Biopsies performed: recommend PPI BID  Enlarged prostate -flomax started and patient has tolerated over last few days -does have PVR so will need urology follow up     Medical Consultants:   GI cards   Discharge Exam:   Vitals:   07/23/18 0900 07/23/18 1000  BP:    Pulse:    Resp: (!) 26 (!) 25  Temp:    SpO2:     Vitals:   07/23/18 0700 07/23/18 0800 07/23/18 0900 07/23/18 1000  BP:  (!) 151/68    Pulse:  77    Resp: (!) 24 (!) 27 (!) 26 (!) 25  Temp:  98.8 F (37.1 C)    TempSrc:  Oral    SpO2:  96%    Weight:      Height:        General exam: Appears calm and comfortable.     The results of significant diagnostics from this hospitalization (including imaging, microbiology, ancillary and laboratory) are listed below for reference.     Procedures and Diagnostic Studies:   Dg Abd 2 Views  Result Date: 07/25/2018 CLINICAL DATA:  Nausea.  Abdominal pain.  Constipation. EXAM: ABDOMEN - 2 VIEW COMPARISON:  No recent prior. FINDINGS: Left base atelectasis/infiltrate. Surgical clips left upper quadrant. Soft tissues are unremarkable. No bowel distention. No free air. No acute bony abnormality. Peripheral vascular calcification. Pelvic calcifications consistent phleboliths. IMPRESSION: 1.  Left base atelectasis/infiltrate. 2. Surgical clips left upper quadrant. No bowel distention. No free air. 3.  Peripheral vascular disease. Electronically Signed   By: Marcello Moores  Register   On: 07/25/2018 12:24     Labs:   Basic Metabolic Panel: Recent Labs  Lab 07/23/18 0252  NA 136  K 4.0    CL 101  CO2 23  GLUCOSE 113*  BUN 17  CREATININE 1.63*  CALCIUM 8.5*   GFR Estimated Creatinine Clearance: 38.5 mL/min (A) (by C-G formula based on SCr of 1.63 mg/dL (H)). Liver Function Tests: No results for input(s): AST, ALT, ALKPHOS, BILITOT, PROT, ALBUMIN in the last 168 hours. No results for input(s): LIPASE, AMYLASE in the last 168 hours. No results for input(s): AMMONIA in the last 168 hours. Coagulation profile No results for input(s): INR, PROTIME in the last 168 hours.  CBC: Recent Labs  Lab 07/23/18 0252  WBC 10.6*  HGB 11.2*  HCT 34.6*  MCV 85.4  PLT 167   Cardiac Enzymes: No results for input(s): CKTOTAL, CKMB, CKMBINDEX, TROPONINI in the last 168 hours. BNP: Invalid input(s): POCBNP CBG: No results for input(s): GLUCAP in the last 168 hours. D-Dimer No results for input(s): DDIMER in the last 72 hours. Hgb A1c No results for input(s): HGBA1C in the last 72 hours. Lipid Profile No results for input(s): CHOL, HDL, LDLCALC, TRIG, CHOLHDL, LDLDIRECT in the last 72 hours. Thyroid function  studies No results for input(s): TSH, T4TOTAL, T3FREE, THYROIDAB in the last 72 hours.  Invalid input(s): FREET3 Anemia work up No results for input(s): VITAMINB12, FOLATE, FERRITIN, TIBC, IRON, RETICCTPCT in the last 72 hours. Microbiology Recent Results (from the past 240 hour(s))  Culture, Urine     Status: None   Collection Time: 07/20/18 11:37 AM  Result Value Ref Range Status   Specimen Description URINE, RANDOM  Final   Special Requests NONE  Final   Culture   Final    NO GROWTH Performed at [facility], [address] [place], [place] [address]    Report Status 07/21/2018 FINAL  Final     Discharge Instructions:   Discharge Instructions    Ambulatory referral to Urology   Complete by:  As directed    Diet - low sodium heart healthy   Complete by:  As directed    Discharge instructions   Complete by:  As directed    Use your incentive  spirometry while at rest 10x hour if able Use caution changing positions Urology follow up   Increase activity slowly   Complete by:  As directed      Allergies as of 07/23/2018      Reactions   Penicillins Other (See Comments)   Did it involve swelling of the face/tongue/throat, SOB, or low BP? Unknown Did it involve sudden or severe rash/hives, skin peeling, or any reaction on the inside of your mouth or nose? Unknown Did you need to seek medical attention at a hospital or doctor's office? Unknown When did it last happen?toddler? If all above answers are NO, may proceed with cephalosporin use.      Medication List    STOP taking these medications   aspirin EC 81 MG tablet   cyclobenzaprine 5 MG tablet Commonly known as:  FLEXERIL   dicyclomine 20 MG tablet Commonly known as:  BENTYL   irbesartan 75 MG tablet Commonly known as:  AVAPRO   traMADol 50 MG tablet Commonly known as:  ULTRAM     TAKE these medications   acetaminophen 325 MG tablet Commonly known as:  TYLENOL Take 650 mg by mouth every 6 (six) hours as needed for headache (pain).   apixaban 5 MG Tabs tablet Commonly known as:  ELIQUIS Take 1 tablet (5 mg total) by mouth 2 (two) times daily.   carvedilol 25 MG tablet Commonly known as:  COREG Take 1.5 tablets (37.5 mg total) by mouth 2 (two) times daily with a meal. What changed:  how much to take   docusate sodium 100 MG capsule Commonly known as:  COLACE Take 1 capsule (100 mg total) by mouth daily.   doxycycline 100 MG capsule Commonly known as:  VIBRAMYCIN Take 1 capsule (100 mg total) by mouth 2 (two) times daily.   fish oil-omega-3 fatty acids 1000 MG capsule Take 1 g by mouth at bedtime.   ipratropium 0.03 % nasal spray Commonly known as:  ATROVENT Place 2 sprays into both nostrils 3 (three) times daily as needed (runny nose).   loratadine 10 MG tablet Commonly known as:  CLARITIN Take 10 mg by mouth daily as needed (seasonal  allergies).   ondansetron 8 MG tablet Commonly known as:  ZOFRAN Take 8 mg by mouth every 6 (six) hours as needed for nausea or vomiting.   pantoprazole 40 MG tablet Commonly known as:  PROTONIX Take 40 mg by mouth 2 (two) times daily.   polyethylene glycol packet Commonly  known as:  MIRALAX / GLYCOLAX Take 17 g by mouth daily as needed for mild constipation.   PRESCRIPTION MEDICATION Inhale into the lungs at bedtime. CPAP   ROGAINE EX Apply 1 application topically daily. Shampoo with Rogaine daily   rosuvastatin 40 MG tablet Commonly known as:  CRESTOR Take 40 mg by mouth at bedtime.   tamsulosin 0.4 MG Caps capsule Commonly known as:  FLOMAX Take 1 capsule (0.4 mg total) by mouth daily after supper.   VITAMIN D3 PO Take 1 tablet by mouth at bedtime.      Follow-up Information    [facility] Follow up.   Specialty:  Cardiology Why:  Please go to the [employer] office to have cardiac event monitor placed on 08/07/2018 at 10:00 for a 10:30 appointment.  Contact information: [address] [place] [phone]       Daune Perch, NP Follow up.   Specialty:  Cardiology Why:  Cardiology hospital follow up and review of heart monitor results on 09/05/2018 at 1:15 with Dr. Elmarie Shiley NP. Please arrive 15 minutes early for check in.  Contact information: [address] [place] [address] [phone]        [name], MD Follow up in 2 day(s).   Specialty:  Internal Medicine Why:  BP check, BMP Contact information: [address] [place] [address] [phone]            Time coordinating discharge: 25 min  Signed:  Geradine Girt DO  [facility] 07/29/2018, 10:20 AM

## 2018-07-30 ENCOUNTER — Other Ambulatory Visit: Payer: Self-pay | Admitting: Internal Medicine

## 2018-07-30 ENCOUNTER — Other Ambulatory Visit: Payer: Self-pay

## 2018-07-30 ENCOUNTER — Ambulatory Visit
Admission: RE | Admit: 2018-07-30 | Discharge: 2018-07-30 | Disposition: A | Discharge status: Home / Self Care | Payer: Medicare HMO | Source: Ambulatory Visit | Attending: Internal Medicine | Admitting: Internal Medicine

## 2018-07-30 DIAGNOSIS — I130 Hypertensive heart and chronic kidney disease with heart failure and stage 1 through stage 4 chronic kidney disease, or unspecified chronic kidney disease: Secondary | ICD-10-CM | POA: Diagnosis not present

## 2018-07-30 DIAGNOSIS — M545 Low back pain, unspecified: Secondary | ICD-10-CM

## 2018-07-30 DIAGNOSIS — R109 Unspecified abdominal pain: Secondary | ICD-10-CM | POA: Diagnosis not present

## 2018-07-30 DIAGNOSIS — Z6826 Body mass index (BMI) 26.0-26.9, adult: Secondary | ICD-10-CM | POA: Diagnosis not present

## 2018-07-30 DIAGNOSIS — N183 Chronic kidney disease, stage 3 (moderate): Secondary | ICD-10-CM | POA: Diagnosis not present

## 2018-07-30 DIAGNOSIS — R55 Syncope and collapse: Secondary | ICD-10-CM | POA: Diagnosis not present

## 2018-07-30 DIAGNOSIS — M47816 Spondylosis without myelopathy or radiculopathy, lumbar region: Secondary | ICD-10-CM

## 2018-07-30 DIAGNOSIS — M1990 Unspecified osteoarthritis, unspecified site: Secondary | ICD-10-CM | POA: Diagnosis not present

## 2018-07-31 DIAGNOSIS — I130 Hypertensive heart and chronic kidney disease with heart failure and stage 1 through stage 4 chronic kidney disease, or unspecified chronic kidney disease: Secondary | ICD-10-CM | POA: Diagnosis not present

## 2018-08-06 ENCOUNTER — Telehealth: Payer: Self-pay | Admitting: *Deleted

## 2018-08-06 NOTE — ED Provider Notes (Signed)
[facility] EMERGENCY DEPARTMENT Provider Note   CSN: [id number] Arrival date & time: 07/17/18  1350    History   Chief Complaint Chief Complaint  Patient presents with  . Abdominal Pain    HPI Maurice Barron is a 73 y.o. male.     HPI 73 year old male with a past medical history significant for CHF, CAD, enlarged prostate, anxiety who presents to the emergency department today for evaluation of generalized abdominal pain he reports nausea for the past 2 days.  Patient states "felt like I need to have a bowel movement".  Reports last bowel movement was this morning he did have issues with constipation 2 days ago prior to pain.  Reports was seen by his PCP yesterday for annual checkup.  Did mention the pain and was scheduled for an outpatient x-ray which she has not had performed yet.  Patient denies any bloody stools.  Denies any urinary symptoms.  Denies any fevers or chills.  Denies any chest pain or shortness of breath.  Pain associated with food.  No alleviating factors.  Decreased appetite.  Pt denies any fever, chill, ha, vision changes, lightheadedness, dizziness, congestion, neck pain, cp, sob, cough,  urinary symptoms, melena, hematochezia, lower extremity paresthesias.  Past Medical History:  Diagnosis Date  . Anxiety   . CHF (congestive heart failure) (HCC)    EF 35%. WITH MEDICATION HIS EF IS NOW 55%  . Coronary artery disease    ruptured plaque in the LAD - started Plavix at that point.  . Diverticulosis   . Enlarged prostate   . Hyperlipidemia   . PVC's (premature ventricular contractions)   . Renal cyst     Patient Active Problem List   Diagnosis Date Noted  . Abdominal pain 07/20/2018  . Orthostatic hypotension 07/19/2018  . AKI (acute kidney injury) ([place]) 07/19/2018  . Hyperkalemia 07/19/2018  . Hyponatremia 07/19/2018  . Near syncope 07/19/2018  . Systolic CHF ([place]) 40/98/1191  . Congestive heart failure ([place]) 12/01/2010  . Hyperlipidemia  12/01/2010    Past Surgical History:  Procedure Laterality Date  . BIOPSY  07/22/2018   Procedure: BIOPSY;  Surgeon: Otis Brace, MD;  Location: [facility];  Service: Gastroenterology;;  . CARDIAC CATHETERIZATION  12/04/2003   EF 30-35%  . CARDIOVASCULAR STRESS TEST  08/20/2006   EF 50%  . ESOPHAGOGASTRODUODENOSCOPY (EGD) WITH PROPOFOL Left 07/22/2018   Procedure: ESOPHAGOGASTRODUODENOSCOPY (EGD) WITH PROPOFOL;  Surgeon: Otis Brace, MD;  Location: [place];  Service: Gastroenterology;  Laterality: Left;  . HYDROCELE EXCISION / REPAIR    . NEPHRECTOMY    . TONSILLECTOMY    . US ECHOCARDIOGRAPHY  08/07/2005   EF 50-55%        Home Medications    Prior to Admission medications   Medication Sig Start Date End Date Taking? Authorizing Provider  acetaminophen (TYLENOL) 325 MG tablet Take 650 mg by mouth every 6 (six) hours as needed for headache (pain).    [provider]  apixaban (ELIQUIS) 5 MG TABS tablet Take 1 tablet (5 mg total) by mouth 2 (two) times daily. 07/22/18   Croitoru, Mihai, MD  carvedilol (COREG) 25 MG tablet Take 1.5 tablets (37.5 mg total) by mouth 2 (two) times daily with a meal. 07/23/18   Geradine Girt, DO  Cholecalciferol (VITAMIN D3 PO) Take 1 tablet by mouth at bedtime.    [provider]  docusate sodium (COLACE) 100 MG capsule Take 1 capsule (100 mg total) by mouth daily. 07/23/18  Geradine Girt, DO  doxycycline (VIBRAMYCIN) 100 MG capsule Take 1 capsule (100 mg total) by mouth 2 (two) times daily. 07/23/18   Geradine Girt, DO  fish oil-omega-3 fatty acids 1000 MG capsule Take 1 g by mouth at bedtime.     [provider]  ipratropium (ATROVENT) 0.03 % nasal spray Place 2 sprays into both nostrils 3 (three) times daily as needed (runny nose).     [provider]  loratadine (CLARITIN) 10 MG tablet Take 10 mg by mouth daily as needed (seasonal allergies).    [provider]  Minoxidil (ROGAINE EX)  Apply 1 application topically daily. Shampoo with Rogaine daily    [provider]  ondansetron (ZOFRAN) 8 MG tablet Take 8 mg by mouth every 6 (six) hours as needed for nausea or vomiting.  07/18/18   [provider]  pantoprazole (PROTONIX) 40 MG tablet Take 40 mg by mouth 2 (two) times daily. 07/16/18   [provider]  polyethylene glycol (MIRALAX / GLYCOLAX) packet Take 17 g by mouth daily as needed for mild constipation. 07/23/18   Geradine Girt, DO  PRESCRIPTION MEDICATION Inhale into the lungs at bedtime. CPAP    [provider]  rosuvastatin (CRESTOR) 40 MG tablet Take 40 mg by mouth at bedtime.     [provider]  tamsulosin (FLOMAX) 0.4 MG CAPS capsule Take 1 capsule (0.4 mg total) by mouth daily after supper. 07/23/18   Geradine Girt, DO    Family History Family History  Problem Relation Age of Onset  . Heart disease Mother   . Sarcoidosis Mother   . Heart attack Father   . Heart attack Brother   . Coronary artery disease Brother     Social History Social History   Tobacco Use  . Smoking status: Former Smoker    Last attempt to quit: 05/08/1992    Years since quitting: 26.2  . Smokeless tobacco: Never Used  Substance Use Topics  . Alcohol use: Yes    Comment: occ  . Drug use: No     Allergies   Penicillins   Review of Systems Review of Systems  All other systems reviewed and are negative.    Physical Exam Updated Vital Signs BP (!) 175/83   Pulse 87   Temp 98.1 F (36.7 C) (Oral)   Resp 16   Ht 5\' 9"  (1.753 m)   Wt 81.6 kg   SpO2 96%   BMI 26.55 kg/m   Physical Exam Vitals signs and nursing note reviewed.  Constitutional:      General: He is not in acute distress.    Appearance: He is well-developed. He is not ill-appearing or toxic-appearing.  HENT:     Head: Normocephalic and atraumatic.  Eyes:     General:        Right eye: No discharge.        Left eye: No discharge.     Conjunctiva/sclera:  Conjunctivae normal.     Pupils: Pupils are equal, round, and reactive to light.  Neck:     Musculoskeletal: Normal range of motion and neck supple.  Cardiovascular:     Rate and Rhythm: Normal rate and regular rhythm.     Heart sounds: Normal heart sounds. No murmur. No friction rub. No gallop.   Pulmonary:     Effort: Pulmonary effort is normal. No respiratory distress.     Breath sounds: Normal breath sounds. No stridor. No wheezing, rhonchi or rales.  Chest:     Chest wall: No tenderness.  Abdominal:     General: Bowel sounds are normal. There is no distension.     Palpations: Abdomen is soft.     Tenderness: There is generalized abdominal tenderness. There is no right CVA tenderness, left CVA tenderness, guarding or rebound. Negative signs include Murphy's sign, Rovsing's sign and McBurney's sign.  Musculoskeletal: Normal range of motion.        General: No tenderness.  Lymphadenopathy:     Cervical: No cervical adenopathy.  Skin:    General: Skin is warm and dry.     Capillary Refill: Capillary refill takes less than 2 seconds.     Findings: No rash.  Neurological:     Mental Status: He is alert and oriented to person, place, and time.  Psychiatric:        Mood and Affect: Mood normal.        Behavior: Behavior normal.        Thought Content: Thought content normal.        Judgment: Judgment normal.      ED Treatments / Results  Labs (all labs ordered are listed, but only abnormal results are displayed) Labs Reviewed  COMPREHENSIVE METABOLIC PANEL - Abnormal; Notable for the following components:      Result Value   Sodium 134 (*)    Glucose, Bld 108 (*)    Creatinine, Ser 1.41 (*)    GFR calc non Af Amer 49 (*)    GFR calc Af Amer 56 (*)    All other components within normal limits  URINALYSIS, ROUTINE W REFLEX MICROSCOPIC - Abnormal; Notable for the following components:   Hgb urine dipstick SMALL (*)    Protein, ur 100 (*)    All other components within  normal limits  URINALYSIS, MICROSCOPIC (REFLEX) - Abnormal; Notable for the following components:   Bacteria, UA RARE (*)    All other components within normal limits  LIPASE, BLOOD  CBC    EKG None  Radiology No results found. CLINICAL DATA:  Acute onset of generalized abdominal pain and nausea. Constipation.  EXAM: CT ABDOMEN AND PELVIS WITHOUT CONTRAST  TECHNIQUE: Multidetector CT imaging of the abdomen and pelvis was performed following the standard protocol without IV contrast.  COMPARISON:  None.  FINDINGS: Lower chest: Minimal bibasilar scarring is noted. The visualized lung apices are otherwise clear.  Hepatobiliary: The liver is unremarkable in appearance. The gallbladder is unremarkable in appearance. The common bile duct remains normal in caliber.  Pancreas: The pancreas is within normal limits.  Spleen: The spleen is unremarkable in appearance.  Adrenals/Urinary Tract: The adrenal glands are unremarkable.  Mild right-sided perinephric stranding is noted. A small right renal cyst is noted. The right kidney is otherwise unremarkable. There is no evidence of hydronephrosis. The patient is status post left-sided nephrectomy. No renal or ureteral stones are identified.  Stomach/Bowel: The stomach is unremarkable in appearance. The small bowel is within normal limits. The appendix is normal in caliber, without evidence of appendicitis. Scattered diverticulosis is noted along the descending and sigmoid colon, without evidence of diverticulitis. A small amount of stool is noted in the colon.  Vascular/Lymphatic: Scattered calcification is seen along the abdominal aorta and its branches. The abdominal aorta is otherwise grossly unremarkable. The inferior vena cava is grossly unremarkable. No retroperitoneal lymphadenopathy is seen. No pelvic sidewall lymphadenopathy is identified.  Reproductive: The bladder is mildly distended. There is  impression on the base of the  bladder by the prostate. The prostate is enlarged, measuring 5.6 cm in transverse dimension.  Other: No additional soft tissue abnormalities are seen.  Musculoskeletal: No acute osseous abnormalities are identified. The visualized musculature is unremarkable in appearance.  IMPRESSION: 1. No acute abnormality seen to explain the patient's symptoms. Small amount of stool noted in the colon. 2. Scattered diverticulosis along the descending and sigmoid colon, without evidence of diverticulitis. 3. Small right renal cyst noted. 4. Enlarged prostate, with impression on the base of the bladder by the prostate. Would correlate with PSA.  Aortic Atherosclerosis (ICD10-I70.0).   Electronically Signed   By: Garald Balding M.D.   On: 07/17/2018 17:52 Procedures Procedures (including critical care time)  Medications Ordered in ED Medications  morphine 4 MG/ML injection 4 mg (4 mg Intravenous Given 07/17/18 1559)  sodium chloride 0.9 % bolus 500 mL (0 mLs Intravenous Stopped 07/17/18 1712)     Initial Impression / Assessment and Plan / ED Course  I have reviewed the triage vital signs and the nursing notes.  Pertinent labs & imaging results that were available during my care of the patient were reviewed by me and considered in my medical decision making (see chart for details).        Patient is nontoxic, nonseptic appearing, in no apparent distress.  Patient's pain and other symptoms adequately managed in emergency department.  Fluid bolus given.  Labs, imaging and vitals reviewed.  Patient does not meet the SIRS or Sepsis criteria.  On repeat exam patient does not have a surgical abdomin and there are no peritoneal signs.  No indication of appendicitis, bowel obstruction, bowel perforation, cholecystitis, diverticulitis.  Labs show no leukocytosis.  Creatinine mildly elevated at 1.4.  Recheck her primary care doctor.  Encourage fluids.  Patient given  fluid bolus in the ED.  UA showed no signs of infection.  Lipase normal.  Patient discharged home with symptomatic treatment and given strict instructions for follow-up with their primary care physician.    Pt is hemodynamically stable, in NAD, & able to ambulate in the ED. Evaluation does not show pathology that would require ongoing emergent intervention or inpatient treatment. I explained the diagnosis to the patient. Pain has been managed & has no complaints prior to dc. Pt is comfortable with above plan and is stable for discharge at this time. All questions were answered prior to disposition. Strict return precautions for f/u to the ED were discussed. Encouraged follow up with PCP.  Pt was dicussed with my attending Dr. Tomi Bamberger who is agreeable with the above plna.      Final Clinical Impressions(s) / ED Diagnoses   Final diagnoses:  Generalized abdominal pain    ED Discharge Orders         Ordered    dicyclomine (BENTYL) 20 MG tablet  2 times daily,   Status:  Discontinued     07/17/18 1853           Doristine Devoid, PA-C 08/06/18 1239    Dorie Rank, MD 08/07/18 [phone]

## 2018-08-06 NOTE — Telephone Encounter (Signed)
Patient call to cancel 3/31 appointment to have 14 day cardiac event monitor applied.  Explained to patient we are attempting to have all cardiac event monitors shipped to the patients home due to COVID-19 precautions.  Patient agreed to have a monitor shipped to his home.  Patient enrolled for [employer] to ship a 14 day cardiac event monitor to his home.  Patient instructed, [employer] will call to confirm shipping address, shipped via 2nd day air, and patient is to call [phone] to activate monitor.

## 2018-08-08 ENCOUNTER — Encounter: Payer: Self-pay | Admitting: Cardiovascular Disease

## 2018-08-08 ENCOUNTER — Ambulatory Visit (INDEPENDENT_AMBULATORY_CARE_PROVIDER_SITE_OTHER): Payer: Medicare HMO

## 2018-08-08 DIAGNOSIS — I480 Paroxysmal atrial fibrillation: Secondary | ICD-10-CM | POA: Diagnosis not present

## 2018-08-13 DIAGNOSIS — R109 Unspecified abdominal pain: Secondary | ICD-10-CM | POA: Diagnosis not present

## 2018-08-13 DIAGNOSIS — K5900 Constipation, unspecified: Secondary | ICD-10-CM | POA: Diagnosis not present

## 2018-08-14 ENCOUNTER — Telehealth: Payer: Self-pay

## 2018-08-14 NOTE — Telephone Encounter (Signed)
Received monitor strips from [employer] for 08/13/18 and pt had a 7 beat run of VT at 3:28pm 08/13/18. The patient says he does not remember what he was doing at that time and has not had any symptoms such as dizziness, palpitations, sob...  Per Dr. Lovena Le (DOD) pt to continue to monitor and call if he has any problems...  But for now no change.   09/05/18 appt with Melina Copa PA... pt willing to make visit virtual... I advised him the we will contact a little closer to the appt to determine if appropriate at the time. Pt agreed.

## 2018-08-22 DIAGNOSIS — N183 Chronic kidney disease, stage 3 (moderate): Secondary | ICD-10-CM | POA: Diagnosis not present

## 2018-08-22 DIAGNOSIS — J189 Pneumonia, unspecified organism: Secondary | ICD-10-CM | POA: Diagnosis not present

## 2018-08-22 DIAGNOSIS — R55 Syncope and collapse: Secondary | ICD-10-CM | POA: Diagnosis not present

## 2018-08-22 DIAGNOSIS — I509 Heart failure, unspecified: Secondary | ICD-10-CM | POA: Diagnosis not present

## 2018-08-22 DIAGNOSIS — R109 Unspecified abdominal pain: Secondary | ICD-10-CM | POA: Diagnosis not present

## 2018-08-22 DIAGNOSIS — I130 Hypertensive heart and chronic kidney disease with heart failure and stage 1 through stage 4 chronic kidney disease, or unspecified chronic kidney disease: Secondary | ICD-10-CM | POA: Diagnosis not present

## 2018-08-22 DIAGNOSIS — R5383 Other fatigue: Secondary | ICD-10-CM | POA: Diagnosis not present

## 2018-08-26 ENCOUNTER — Other Ambulatory Visit: Payer: Self-pay

## 2018-08-27 DIAGNOSIS — R55 Syncope and collapse: Secondary | ICD-10-CM | POA: Diagnosis not present

## 2018-08-27 DIAGNOSIS — R109 Unspecified abdominal pain: Secondary | ICD-10-CM | POA: Diagnosis not present

## 2018-08-27 DIAGNOSIS — K5900 Constipation, unspecified: Secondary | ICD-10-CM | POA: Diagnosis not present

## 2018-08-27 DIAGNOSIS — N183 Chronic kidney disease, stage 3 (moderate): Secondary | ICD-10-CM | POA: Diagnosis not present

## 2018-08-27 DIAGNOSIS — I130 Hypertensive heart and chronic kidney disease with heart failure and stage 1 through stage 4 chronic kidney disease, or unspecified chronic kidney disease: Secondary | ICD-10-CM | POA: Diagnosis not present

## 2018-08-27 DIAGNOSIS — J189 Pneumonia, unspecified organism: Secondary | ICD-10-CM | POA: Diagnosis not present

## 2018-08-27 DIAGNOSIS — R5383 Other fatigue: Secondary | ICD-10-CM | POA: Diagnosis not present

## 2018-08-27 DIAGNOSIS — I509 Heart failure, unspecified: Secondary | ICD-10-CM | POA: Diagnosis not present

## 2018-08-29 DIAGNOSIS — R55 Syncope and collapse: Secondary | ICD-10-CM | POA: Diagnosis not present

## 2018-08-29 DIAGNOSIS — R5383 Other fatigue: Secondary | ICD-10-CM | POA: Diagnosis not present

## 2018-08-29 DIAGNOSIS — R109 Unspecified abdominal pain: Secondary | ICD-10-CM | POA: Diagnosis not present

## 2018-09-02 ENCOUNTER — Telehealth: Payer: Self-pay

## 2018-09-02 NOTE — Telephone Encounter (Signed)
YOUR CARDIOLOGY TEAM HAS ARRANGED FOR AN E-VISIT FOR YOUR APPOINTMENT - PLEASE REVIEW IMPORTANT INFORMATION BELOW SEVERAL DAYS PRIOR TO YOUR APPOINTMENT  Due to the recent COVID-19 pandemic, we are transitioning in-person office visits to tele-medicine visits in an effort to decrease unnecessary exposure to our patients, their families, and staff. These visits are billed to your insurance just like a normal visit is. We also encourage you to sign up for MyChart if you have not already done so. You will need a smartphone if possible. For patients that do not have this, we can still complete the visit using a regular telephone but do prefer a smartphone to enable video when possible. You may have a family member that lives with you that can help. If possible, we also ask that you have a blood pressure cuff and scale at home to measure your blood pressure, heart rate and weight prior to your scheduled appointment. Patients with clinical needs that need an in-person evaluation and testing will still be able to come to the office if absolutely necessary. If you have any questions, feel free to call our office.     YOUR PROVIDER WILL BE USING THE FOLLOWING PLATFORM TO COMPLETE YOUR VISIT: [email or web address]  . IF USING MYCHART - How to Download the MyChart App to Your SmartPhone   - If Apple, go to App Store and type in MyChart in the search bar and download the app. If Android, ask patient to go to Google Play Store and type in MyChart in the search bar and download the app. The app is free but as with any other app downloads, your phone may require you to verify saved payment information or Apple/Android password.  - You will need to then log into the app with your MyChart username and password, and select [facility] as your healthcare provider to link the account.  - When it is time for your visit, go to the MyChart app, find appointments, and click Begin Video Visit. Be sure to Select Allow for your device to  access the Microphone and Camera for your visit. You will then be connected, and your provider will be with you shortly.  **If you have any issues connecting or need assistance, please contact MyChart service desk [phone] ([phone])**  **If using a computer, in order to ensure the best quality for your visit, you will need to use either of the following Internet Browsers: Google Chrome or Microsoft Edge**  . IF USING DOXIMITY or [email or web address] - The staff will give you instructions on receiving your link to join the meeting the day of your visit.      2-3 DAYS BEFORE YOUR APPOINTMENT  You will receive a telephone call from one of our [facility] team members - your caller ID may say "Unknown caller." If this is a video visit, we will walk you through how to get the video launched on your phone. We will remind you check your blood pressure, heart rate and weight prior to your scheduled appointment. If you have an Apple Watch or Kardia, please upload any pertinent ECG strips the day before or morning of your appointment to MyChart. Our staff will also make sure you have reviewed the consent and agree to move forward with your scheduled tele-health visit.     THE DAY OF YOUR APPOINTMENT  Approximately 15 minutes prior to your scheduled appointment, you will receive a telephone call from one of [facility] team - your caller ID may say "Unknown caller."    Our staff will confirm medications, vital signs for the day and any symptoms you may be experiencing. Please have this information available prior to the time of visit start. It may also be helpful for you to have a pad of paper and pen handy for any instructions given during your visit. They will also walk you through joining the smartphone meeting if this is a video visit.    CONSENT FOR TELE-HEALTH VISIT - PLEASE REVIEW  I hereby voluntarily request, consent and authorize [facility] and its employed or contracted physicians, physician  assistants, nurse practitioners or other licensed health care professionals (the Practitioner), to provide me with telemedicine health care services (the "Services") as deemed necessary by the treating Practitioner. I acknowledge and consent to receive the Services by the Practitioner via telemedicine. I understand that the telemedicine visit will involve communicating with the Practitioner through live audiovisual communication technology and the disclosure of certain medical information by electronic transmission. I acknowledge that I have been given the opportunity to request an in-person assessment or other available alternative prior to the telemedicine visit and am voluntarily participating in the telemedicine visit.  I understand that I have the right to withhold or withdraw my consent to the use of telemedicine in the course of my care at any time, without affecting my right to future care or treatment, and that the Practitioner or I may terminate the telemedicine visit at any time. I understand that I have the right to inspect all information obtained and/or recorded in the course of the telemedicine visit and may receive copies of available information for a reasonable fee.  I understand that some of the potential risks of receiving the Services via telemedicine include:  . Delay or interruption in medical evaluation due to technological equipment failure or disruption; . Information transmitted may not be sufficient (e.g. poor resolution of images) to allow for appropriate medical decision making by the Practitioner; and/or  . In rare instances, security protocols could fail, causing a breach of personal health information.  Furthermore, I acknowledge that it is my responsibility to provide information about my medical history, conditions and care that is complete and accurate to the best of my ability. I acknowledge that Practitioner's advice, recommendations, and/or decision may be based on  factors not within their control, such as incomplete or inaccurate data provided by me or distortions of diagnostic images or specimens that may result from electronic transmissions. I understand that the practice of medicine is not an exact science and that Practitioner makes no warranties or guarantees regarding treatment outcomes. I acknowledge that I will receive a copy of this consent concurrently upon execution via email to the email address I last provided but may also request a printed copy by calling the office of [facility].    I understand that my insurance will be billed for this visit.   I have read or had this consent read to me. . I understand the contents of this consent, which adequately explains the benefits and risks of the Services being provided via telemedicine.  . I have been provided ample opportunity to ask questions regarding this consent and the Services and have had my questions answered to my satisfaction. . I give my informed consent for the services to be provided through the use of telemedicine in my medical care  By participating in this telemedicine visit I agree to the above.  

## 2018-09-04 ENCOUNTER — Telehealth: Payer: Self-pay | Admitting: Cardiology

## 2018-09-04 NOTE — Telephone Encounter (Signed)
New Message   Patient is calling because he did not receive the email for the virtual visit. His correct email address is [email or web address] Please resend. I went to [email or web address] which is incorrect.

## 2018-09-05 ENCOUNTER — Other Ambulatory Visit: Payer: Self-pay

## 2018-09-05 ENCOUNTER — Encounter: Payer: Self-pay | Admitting: Cardiology

## 2018-09-05 ENCOUNTER — Telehealth (INDEPENDENT_AMBULATORY_CARE_PROVIDER_SITE_OTHER): Payer: Medicare HMO | Admitting: Cardiology

## 2018-09-05 VITALS — BP 164/92 | Ht 68.0 in | Wt 160.0 lb

## 2018-09-05 DIAGNOSIS — I10 Essential (primary) hypertension: Secondary | ICD-10-CM

## 2018-09-05 DIAGNOSIS — I480 Paroxysmal atrial fibrillation: Secondary | ICD-10-CM

## 2018-09-05 DIAGNOSIS — R55 Syncope and collapse: Secondary | ICD-10-CM

## 2018-09-05 DIAGNOSIS — R0600 Dyspnea, unspecified: Secondary | ICD-10-CM

## 2018-09-05 DIAGNOSIS — I25118 Atherosclerotic heart disease of native coronary artery with other forms of angina pectoris: Secondary | ICD-10-CM

## 2018-09-05 DIAGNOSIS — R0789 Other chest pain: Secondary | ICD-10-CM

## 2018-09-05 DIAGNOSIS — N179 Acute kidney failure, unspecified: Secondary | ICD-10-CM

## 2018-09-05 DIAGNOSIS — R5383 Other fatigue: Secondary | ICD-10-CM

## 2018-09-05 DIAGNOSIS — R0609 Other forms of dyspnea: Secondary | ICD-10-CM

## 2018-09-05 MED ORDER — AMLODIPINE BESYLATE 5 MG PO TABS: 5.0000 mg | ORAL_TABLET | Freq: Every day | ORAL | 3 refills | Status: DC

## 2018-09-05 NOTE — Progress Notes (Signed)
Virtual Visit via Video Note   This visit type was conducted due to national recommendations for restrictions regarding the COVID-19 Pandemic (e.g. social distancing) in an effort to limit this patient's exposure and mitigate transmission in our community.  Due to his co-morbid illnesses, this patient is at least at moderate risk for complications without adequate follow up.  This format is felt to be most appropriate for this patient at this time.  All issues noted in this document were discussed and addressed.  A limited physical exam was performed with this format.  Please refer to the patient's chart for his consent to telehealth for [facility].   Evaluation Performed:  Follow-up visit  Date:  09/05/2018   ID:  Maurice Barron, DOB Apr 21, 1946, MRN [id number]  Patient Location: Home Provider Location: Office  PCP:  [name], MD  Cardiologist:  Mertie Moores, MD  Electrophysiologist:  None   Chief Complaint:  Follow up afib  History of Present Illness:    Maurice Barron is a 73 y.o. male with  a hx of CAD, h/o systolic HF w/ improvement in LVEF, h/o PVCs, HLD, BPH and recent GI complaints, had left kidney removed about 30 yrs ago for multilocular cysts.  scheduled for elective EGD, seen today in the hospital for the evaluation of new onset atrial fibrillation  Heart cath in 2005 revealed a ruptured plaque in his proximal left anterior descending artery. He had moderate irregularities in the other coronary arteries. He was started on Plavix at that time. He's had a history of congestive heart failure. His initial ejection fraction was around 35%. With good medical therapy, his ejection fraction improved to 55%.   He was admitted to the hospital 07/19/2018 with weakness, multiple falls and near syncope. He was hypotensive and had AKI related to reduced PO intake and hypotension. SCr was up to 1.9 but came down to 1.52 with treatment and IV hydration. Troponins were mildly  elevated on flat pattern. He was having some GI complaints and EGD showed mild gastritis and Small tongue of salmon-colored mucosa GE junction. Biopsies performed. PPI therapy recommended. During recovering in Endoscopy, pt went into atrial fibrillation w/ RVR, 118 bpm but completely asymptomatic. Pt was given a dose of esmolol and then started on IV Cardizem drip 7.5 mg/kg/ hr and during my examination spontaneously converted to NSR. HR in the 70s. He denies any h/o stroke/ TIA. No h/o DM.   Echo on 07/23/18 showed ECHO 09/73/5329 normal systolic function with an ejection fraction of 60-65%. No evidence of left ventricular regional wall  motion abnormalities. Left atrial size was mildly dilated.  He is being seen today for hospital follow up after a cardiac event monitor was placed from 4/2-4/15/20 that showed NSR, sinus tachycardia and sinus bradycardia. There was one single 7 beat run of Non sustained VT. No significant arrhythmias to explain episode of syncope.   The patient says the episode was related to flexeril and he has felt fine since stopping it. He is now eating better on new GI meds. Has f/u with GI in 2 weeks.   He is lacking energy and feels fatigued. He was exercising 1.5-2 hrs per day at [facility] before it closed for COVID. No he gets short of breath walking halfway up the block. He has constant mild epigastric discomfort but no substernal, angina like chest pain.   BP has been running 140's-150's/70's-80's.   The patient does not have symptoms concerning for COVID-19 infection (fever,  chills, cough, or new shortness of breath).    Past Medical History:  Diagnosis Date  . Anxiety   . CHF (congestive heart failure) (HCC)    EF 35%. WITH MEDICATION HIS EF IS NOW 55%  . Coronary artery disease    ruptured plaque in the LAD - started Plavix at that point.  . Diverticulosis   . Enlarged prostate   . Hyperlipidemia   . PVC's (premature ventricular contractions)   . Renal cyst     Past Surgical History:  Procedure Laterality Date  . BIOPSY  07/22/2018   Procedure: BIOPSY;  Surgeon: Otis Brace, MD;  Location: [place];  Service: Gastroenterology;;  . CARDIAC CATHETERIZATION  12/04/2003   EF 30-35%  . CARDIOVASCULAR STRESS TEST  08/20/2006   EF 50%  . ESOPHAGOGASTRODUODENOSCOPY (EGD) WITH PROPOFOL Left 07/22/2018   Procedure: ESOPHAGOGASTRODUODENOSCOPY (EGD) WITH PROPOFOL;  Surgeon: Otis Brace, MD;  Location: [place];  Service: Gastroenterology;  Laterality: Left;  . HYDROCELE EXCISION / REPAIR    . NEPHRECTOMY    . TONSILLECTOMY    . US ECHOCARDIOGRAPHY  08/07/2005   EF 50-55%     Current Meds  Medication Sig  . acetaminophen (TYLENOL) 325 MG tablet Take 650 mg by mouth every 6 (six) hours as needed for headache (pain).  Marland Kitchen apixaban (ELIQUIS) 5 MG TABS tablet Take 1 tablet (5 mg total) by mouth 2 (two) times daily.  . carvedilol (COREG) 25 MG tablet Take 1.5 tablets (37.5 mg total) by mouth 2 (two) times daily with a meal.  . Cholecalciferol (VITAMIN D3 PO) Take 1 tablet by mouth at bedtime.  . docusate sodium (COLACE) 100 MG capsule Take 1 capsule (100 mg total) by mouth daily.  Marland Kitchen doxycycline (VIBRAMYCIN) 100 MG capsule Take 1 capsule (100 mg total) by mouth 2 (two) times daily.  . fish oil-omega-3 fatty acids 1000 MG capsule Take 1 g by mouth at bedtime.   Marland Kitchen ipratropium (ATROVENT) 0.03 % nasal spray Place 2 sprays into both nostrils 3 (three) times daily as needed (runny nose).   Marland Kitchen loratadine (CLARITIN) 10 MG tablet Take 10 mg by mouth daily as needed (seasonal allergies).  . Minoxidil (ROGAINE EX) Apply 1 application topically daily. Shampoo with Rogaine daily  . ondansetron (ZOFRAN) 8 MG tablet Take 8 mg by mouth every 6 (six) hours as needed for nausea or vomiting.   . pantoprazole (PROTONIX) 40 MG tablet Take 40 mg by mouth 2 (two) times daily.  . polyethylene glycol (MIRALAX / GLYCOLAX) packet Take 17 g by mouth daily as needed for mild  constipation.  Marland Kitchen PRESCRIPTION MEDICATION Inhale into the lungs at bedtime. CPAP  . rosuvastatin (CRESTOR) 40 MG tablet Take 40 mg by mouth at bedtime.   . tamsulosin (FLOMAX) 0.4 MG CAPS capsule Take 1 capsule (0.4 mg total) by mouth daily after supper.     Allergies:   Penicillins   Social History   Tobacco Use  . Smoking status: Former Smoker    Last attempt to quit: 05/08/1992    Years since quitting: 26.3  . Smokeless tobacco: Never Used  Substance Use Topics  . Alcohol use: Yes    Comment: occ  . Drug use: No     Family Hx: The patient's family history includes Coronary artery disease in his brother; Heart attack in his brother and father; Heart disease in his mother; Sarcoidosis in his mother.  ROS:   Please see the history of present illness.     All  other systems reviewed and are negative.   Prior CV studies:   The following studies were reviewed today:  ECHO 07/23/2018  1. The left ventricle has normal systolic function with an ejection fraction of 60-65%. The cavity size was normal. Left ventricular diastolic Doppler parameters are consistent with impaired relaxation. No evidence of left ventricular regional wall  motion abnormalities. 2. The right ventricle has normal systolic function. The cavity was normal. There is no increase in right ventricular wall thickness. Right ventricular systolic pressure could not be assessed. 3. Left atrial size was mildly dilated.   Labs/Other Tests and Data Reviewed:    EKG:  An ECG dated 07/24/18 was personally reviewed today and demonstrated:  Sinus rhythm iwth diffues non-specific t changes  Recent Labs: 07/17/2018: ALT 20 07/23/2018: BUN 17; Creatinine, Ser 1.63; Hemoglobin 11.2; Platelets 167; Potassium 4.0; Sodium 136   Recent Lipid Panel No results found for: CHOL, TRIG, HDL, CHOLHDL, LDLCALC, LDLDIRECT  Wt Readings from Last 3 Encounters:  09/05/18 160 lb (72.6 kg)  07/22/18 180 lb 5.4 oz (81.8 kg)  07/17/18  179 lb 12.8 oz (81.6 kg)     Objective:    Vital Signs:  BP (!) 164/92   Ht 5\' 8"  (1.727 m)   Wt 160 lb (72.6 kg)   BMI 24.33 kg/m    VITAL SIGNS:  reviewed GEN:  no acute distress RESPIRATORY:  normal respiratory effort, symmetric expansion CARDIOVASCULAR:  no peripheral edema NEURO:  alert and oriented x 3, no obvious focal deficit PSYCH:  normal affect  ASSESSMENT & PLAN:    1. Atrial fibrillation with RVR occurred during endoscopic procedure -No further episodes of afib on event monitor. Carvedilol was increased in hospital.  -Pt was started on Eliquis for stroke risk reduction. No unusual bleeding.  -He has had no received episodes of afib, although he says he had no awareness when he was in it at the hospital.  -I instructed him to notify us if his HR reads over 110 on his BP monitor or if he has symptoms. He says his HR has been running in the 70's-80's.  2. Syncope -No evidence of arrhythmia to cause syncope. Likely related to hypotension.  -Pt feels sure it was related to taking flexeril. He is feeling much better since it was discontinued.  3. Hypertension -BP still mildly elevated. ARB is on hold due to AKI.  -Will increase amlodipine from 2.5 t- 5 mg. Can go up to 10 if needed.   4. Acute on chronic kidney disease -SCr up to 1.63 in hospital. ARB put on hold.  -Will follow up BMet.   5. CAD -Clopidogrel stopped when Eliquis started.  -Pt with an atypical constant epigastric discomfort likely GI related but he is also having DOE which is new for him. I will plan for lexiscan myoview in a month. He can cancel this if he gets back to normal before then. Hopefully COVID restrictions will be lifted by then.   6. H/O systolic heart failure -EF was down to 35% but with good medical therapy improved to 55%. Echo on 07/22/18 showed EF 60-65%. -Continues on coreg, ARB put on hold d/t AKI/hypotension.  -Seems volume stable.   7. Fatigue and DOE -As above will check  lexiscan myoview. Check CBC for anemia and TSH given recent afib.     COVID-19 Education: The signs and symptoms of COVID-19 were discussed with the patient and how to seek care for testing (follow up with PCP or arrange  E-visit).  The importance of social distancing was discussed today.  Time:   Today, I have spent 25 minutes with the patient with telehealth technology discussing the above problems.     Medication Adjustments/Labs and Tests Ordered: Current medicines are reviewed at length with the patient today.  Concerns regarding medicines are outlined above.   Tests Ordered: Orders Placed This Encounter  Procedures  . Basic metabolic panel  . CBC  . TSH  . MYOCARDIAL PERFUSION IMAGING    Medication Changes: Meds ordered this encounter  Medications  . amLODipine (NORVASC) 5 MG tablet    Sig: Take 1 tablet (5 mg total) by mouth daily.    Dispense:  90 tablet    Refill:  3    Disposition:  Follow up in 3 month(s)  Signed, Daune Perch, NP  09/05/2018 5:38 PM    [facility]

## 2018-09-05 NOTE — Patient Instructions (Addendum)
Medication Instructions:  INCREASE: Amlodipine to 5 mg once a day   If you need a refill on your cardiac medications before your next appointment, please call your pharmacy.   Lab work: FUTURE: BMET, CBC, TSH   If you have labs (blood work) drawn today and your tests are completely normal, you will receive your results only by: Marland Kitchen MyChart Message (if you have MyChart) OR . A paper copy in the mail If you have any lab test that is abnormal or we need to change your treatment, we will call you to review the results.  Testing/Procedures: Your physician has requested that you have a lexiscan myoview in 1 month . For further information please visit [email or web address]. Please follow instruction sheet, as given.    Follow-Up: At [facility], you and your health needs are our priority.  As part of our continuing mission to provide you with exceptional heart care, we have created designated Provider Care Teams.  These Care Teams include your primary Cardiologist (physician) and Advanced Practice Providers (APPs -  Physician Assistants and Nurse Practitioners) who all work together to provide you with the care you need, when you need it. You will need a follow up appointment in:  3 months. You may see Mertie Moores, MD or one of the following Advanced Practice Providers on your designated Care Team: Richardson Dopp, PA-C [place], [place] . Daune Perch, NP  Any Other Special Instructions Will Be Listed Below (If Applicable).  'Lifestyle Modifications to Prevent and Treat Heart Disease -Recommend heart healthy/Mediterranean diet, with whole grains, fruits, vegetable, fish, lean meats, nuts, and olive oil. Limit salt. -Recommend moderate walking, 3-5 times/week for 30-50 minutes each session. Aim for at least 150 minutes.week. Goal should be pace of 3 miles/hours, or walking 1.5 miles in 30 minutes -Recommend avoidance of tobacco products. Avoid excess alcohol.

## 2018-09-09 DIAGNOSIS — I130 Hypertensive heart and chronic kidney disease with heart failure and stage 1 through stage 4 chronic kidney disease, or unspecified chronic kidney disease: Secondary | ICD-10-CM | POA: Diagnosis not present

## 2018-09-09 DIAGNOSIS — N183 Chronic kidney disease, stage 3 (moderate): Secondary | ICD-10-CM | POA: Diagnosis not present

## 2018-09-09 DIAGNOSIS — M79675 Pain in left toe(s): Secondary | ICD-10-CM | POA: Diagnosis not present

## 2018-09-09 DIAGNOSIS — M109 Gout, unspecified: Secondary | ICD-10-CM | POA: Diagnosis not present

## 2018-09-09 DIAGNOSIS — I509 Heart failure, unspecified: Secondary | ICD-10-CM | POA: Diagnosis not present

## 2018-09-09 DIAGNOSIS — R5383 Other fatigue: Secondary | ICD-10-CM | POA: Diagnosis not present

## 2018-09-10 DIAGNOSIS — G4733 Obstructive sleep apnea (adult) (pediatric): Secondary | ICD-10-CM | POA: Diagnosis not present

## 2018-09-20 ENCOUNTER — Telehealth: Payer: Self-pay

## 2018-09-20 NOTE — Telephone Encounter (Signed)
Returned pt's phone call. Concerns have been addressed

## 2018-09-20 NOTE — Telephone Encounter (Signed)
New message   The patient is set up for myocardial perfusion on 5/20. The patient has some concerns and would like the nurse to call him back to discuss.    Myocardial perfusion test order by Pecolia Ades.

## 2018-09-23 ENCOUNTER — Telehealth (HOSPITAL_COMMUNITY): Payer: Self-pay | Admitting: *Deleted

## 2018-09-23 NOTE — Telephone Encounter (Signed)
Patient given detailed instructions per Myocardial Perfusion Study Information Sheet for the test on 09/26/18. Patient notified to arrive 15 minutes early and that it is imperative to arrive on time for appointment to keep from having the test rescheduled.  If you need to cancel or reschedule your appointment, please call the office within 24 hours of your appointment. . Patient verbalized understanding. Maurice Barron Jacqueline    

## 2018-09-24 DIAGNOSIS — N403 Nodular prostate with lower urinary tract symptoms: Secondary | ICD-10-CM | POA: Diagnosis not present

## 2018-09-24 DIAGNOSIS — N281 Cyst of kidney, acquired: Secondary | ICD-10-CM | POA: Diagnosis not present

## 2018-09-24 DIAGNOSIS — R3121 Asymptomatic microscopic hematuria: Secondary | ICD-10-CM | POA: Diagnosis not present

## 2018-09-24 DIAGNOSIS — R350 Frequency of micturition: Secondary | ICD-10-CM | POA: Diagnosis not present

## 2018-09-24 DIAGNOSIS — Z905 Acquired absence of kidney: Secondary | ICD-10-CM | POA: Diagnosis not present

## 2018-09-25 ENCOUNTER — Ambulatory Visit (HOSPITAL_COMMUNITY): Payer: Medicare HMO | Attending: Internal Medicine

## 2018-09-25 ENCOUNTER — Other Ambulatory Visit: Payer: Self-pay

## 2018-09-25 DIAGNOSIS — R0600 Dyspnea, unspecified: Secondary | ICD-10-CM

## 2018-09-25 DIAGNOSIS — R0609 Other forms of dyspnea: Secondary | ICD-10-CM | POA: Diagnosis not present

## 2018-09-25 DIAGNOSIS — R0789 Other chest pain: Secondary | ICD-10-CM | POA: Insufficient documentation

## 2018-09-25 DIAGNOSIS — I480 Paroxysmal atrial fibrillation: Secondary | ICD-10-CM | POA: Insufficient documentation

## 2018-09-25 LAB — MYOCARDIAL PERFUSION IMAGING
LV dias vol: 92 mL (ref 62–150)
LV sys vol: 47 mL
Peak HR: 78 {beats}/min
Rest HR: 63 {beats}/min
SDS: 1
SRS: 2
SSS: 3
TID: 1.18

## 2018-09-25 MED ORDER — REGADENOSON 0.4 MG/5ML IV SOLN
0.4000 mg | Freq: Once | INTRAVENOUS | Status: AC
  Administered 2018-09-25: 0.4 mg via INTRAVENOUS

## 2018-09-25 MED ORDER — TECHNETIUM TC 99M TETROFOSMIN IV KIT
28.2000 | PACK | Freq: Once | INTRAVENOUS | Status: AC | PRN
  Administered 2018-09-25: 28.2 via INTRAVENOUS
  Filled 2018-09-25: qty 29

## 2018-09-25 MED ORDER — TECHNETIUM TC 99M TETROFOSMIN IV KIT
7.5000 | PACK | Freq: Once | INTRAVENOUS | Status: AC | PRN
  Administered 2018-09-25: 7.5 via INTRAVENOUS
  Filled 2018-09-25: qty 8

## 2018-09-26 DIAGNOSIS — R5383 Other fatigue: Secondary | ICD-10-CM | POA: Diagnosis not present

## 2018-09-26 DIAGNOSIS — I130 Hypertensive heart and chronic kidney disease with heart failure and stage 1 through stage 4 chronic kidney disease, or unspecified chronic kidney disease: Secondary | ICD-10-CM | POA: Diagnosis not present

## 2018-09-26 DIAGNOSIS — N401 Enlarged prostate with lower urinary tract symptoms: Secondary | ICD-10-CM | POA: Diagnosis not present

## 2018-09-26 DIAGNOSIS — N183 Chronic kidney disease, stage 3 (moderate): Secondary | ICD-10-CM | POA: Diagnosis not present

## 2018-09-26 DIAGNOSIS — M109 Gout, unspecified: Secondary | ICD-10-CM | POA: Diagnosis not present

## 2018-09-26 DIAGNOSIS — R55 Syncope and collapse: Secondary | ICD-10-CM | POA: Diagnosis not present

## 2018-09-26 DIAGNOSIS — I2510 Atherosclerotic heart disease of native coronary artery without angina pectoris: Secondary | ICD-10-CM | POA: Diagnosis not present

## 2018-09-26 DIAGNOSIS — R109 Unspecified abdominal pain: Secondary | ICD-10-CM | POA: Diagnosis not present

## 2018-09-26 DIAGNOSIS — I509 Heart failure, unspecified: Secondary | ICD-10-CM | POA: Diagnosis not present

## 2018-10-03 ENCOUNTER — Telehealth: Payer: Self-pay

## 2018-10-03 NOTE — Telephone Encounter (Signed)
Virtual Visit Pre-Appointment Phone Call  "(Name), I am calling you today to discuss your upcoming appointment. We are currently trying to limit exposure to the virus that causes COVID-19 by seeing patients at home rather than in the office."  1. "What is the BEST phone number to call the day of the visit?" - include this in appointment notes  2. "Do you have or have access to (through a family member/friend) a smartphone with video capability that we can use for your visit?" a. If yes - list this number in appt notes as "cell" (if different from BEST phone #) and list the appointment type as a VIDEO visit in appointment notes b. If no - list the appointment type as a PHONE visit in appointment notes  Confirm consent - "In the setting of the current Covid19 crisis, you are scheduled for a (phone or video) visit with your provider on (date) at (time).  Just as we do with many in-office visits, in order for you to participate in this visit, we must obtain consent.  If you'd like, I can send this to your mychart (if signed up) or email for you to review.  Otherwise, I can obtain your verbal consent now.  All virtual visits are billed to your insurance company just like a normal visit would be.  By agreeing to a virtual visit, we'd like you to understand that the technology does not allow for your provider to perform an examination, and thus may limit your provider's ability to fully assess your condition. If your provider identifies any concerns that need to be evaluated in person, we will make arrangements to do so.  Finally, though the technology is pretty good, we cannot assure that it will always work on either your or our end, and in the setting of a video visit, we may have to convert it to a phone-only visit.  In either situation, we cannot ensure that we have a secure connection.  Are you willing to proceed?" YES  3. Advise patient to be prepared - "Two hours prior to your appointment, go  ahead and check your blood pressure, pulse, oxygen saturation, and your weight (if you have the equipment to check those) and write them all down. When your visit starts, your provider will ask you for this information. If you have an Apple Watch or Kardia device, please plan to have heart rate information ready on the day of your appointment. Please have a pen and paper handy nearby the day of the visit as well."  4. Give patient instructions for MyChart download to smartphone OR [email or web address] as below if video visit (depending on what platform provider is using)  5. Inform patient they will receive a phone call 15 minutes prior to their appointment time (may be from unknown caller ID) so they should be prepared to answer    TELEPHONE CALL NOTE  Maurice Barron has been deemed a candidate for a follow-up tele-health visit to limit community exposure during the Covid-19 pandemic. I spoke with the patient via phone to ensure availability of phone/video source, confirm preferred email & phone number, and discuss instructions and expectations.  I reminded Maurice Barron to be prepared with any vital sign and/or heart rhythm information that could potentially be obtained via home monitoring, at the time of his visit. I reminded Maurice Barron to expect a phone call prior to his visit.  [name], [facility] 10/03/2018 3:47 PM   INSTRUCTIONS FOR  DOWNLOADING THE MYCHART APP TO SMARTPHONE  - The patient must first make sure to have activated MyChart and know their login information - If Apple, go to [employer] and type in MyChart in the search bar and download the app. If Android, ask patient to go to [employer] and type in [place] in the search bar and download the app. The app is free but as with any other app downloads, their phone may require them to verify saved payment information or Apple/Android password.  - The patient will need to then log into the app with their MyChart  username and password, and select [facility] as their healthcare provider to link the account. When it is time for your visit, go to the MyChart app, find appointments, and click Begin Video Visit. Be sure to Select Allow for your device to access the Microphone and Camera for your visit. You will then be connected, and your provider will be with you shortly.  **If they have any issues connecting, or need assistance please contact MyChart service desk [phone])**  **If using a computer, in order to ensure the best quality for their visit they will need to use either of the following Internet Browsers: [employer], or Google Chrome**  IF USING DOXIMITY or DOXY.ME - The patient will receive a link just prior to their visit by text.     FULL LENGTH CONSENT FOR TELE-HEALTH VISIT   I hereby voluntarily request, consent and authorize [facility] and its employed or contracted physicians, physician assistants, nurse practitioners or other licensed health care professionals (the Practitioner), to provide me with telemedicine health care services (the "Services") as deemed necessary by the treating Practitioner. I acknowledge and consent to receive the Services by the Practitioner via telemedicine. I understand that the telemedicine visit will involve communicating with the Practitioner through live audiovisual communication technology and the disclosure of certain medical information by electronic transmission. I acknowledge that I have been given the opportunity to request an in-person assessment or other available alternative prior to the telemedicine visit and am voluntarily participating in the telemedicine visit.  I understand that I have the right to withhold or withdraw my consent to the use of telemedicine in the course of my care at any time, without affecting my right to future care or treatment, and that the Practitioner or I may terminate the telemedicine visit at any  time. I understand that I have the right to inspect all information obtained and/or recorded in the course of the telemedicine visit and may receive copies of available information for a reasonable fee.  I understand that some of the potential risks of receiving the Services via telemedicine include:  Marland Kitchen Delay or interruption in medical evaluation due to technological equipment failure or disruption; . Information transmitted may not be sufficient (e.g. poor resolution of images) to allow for appropriate medical decision making by the Practitioner; and/or  . In rare instances, security protocols could fail, causing a breach of personal health information.  Furthermore, I acknowledge that it is my responsibility to provide information about my medical history, conditions and care that is complete and accurate to the best of my ability. I acknowledge that Practitioner's advice, recommendations, and/or decision may be based on factors not within their control, such as incomplete or inaccurate data provided by me or distortions of diagnostic images or specimens that may result from electronic transmissions. I understand that the practice of medicine is not an exact science and that  Practitioner makes no warranties or guarantees regarding treatment outcomes. I acknowledge that I will receive a copy of this consent concurrently upon execution via email to the email address I last provided but may also request a printed copy by calling the office of [facility].    I understand that my insurance will be billed for this visit.   I have read or had this consent read to me. . I understand the contents of this consent, which adequately explains the benefits and risks of the Services being provided via telemedicine.  . I have been provided ample opportunity to ask questions regarding this consent and the Services and have had my questions answered to my satisfaction. . I give my informed consent for the services  to be provided through the use of telemedicine in my medical care  By participating in this telemedicine visit I agree to the above.

## 2018-10-17 ENCOUNTER — Telehealth: Payer: Self-pay | Admitting: *Deleted

## 2018-10-17 NOTE — Telephone Encounter (Signed)
   TELEPHONE CALL NOTE  This patient has been deemed a candidate for follow-up tele-health visit to limit community exposure during the Covid-19 pandemic. I spoke with the patient via phone to discuss instructions.  A Virtual Office Visit appointment type has been scheduled for [employer] with Kathyrn Drown, NP, with "VIDEO" or "TELEPHONE" in the appointment notes - patient prefers VIDEO type.  I have either confirmed the patient is active in MyChart or offered to send sign-up link to phone/email via Mychart icon beside patient's photo.pt declines mychart but has given verbal consent for virtual visit.  Jeanann Lewandowsky, RMA 10/17/2018 10:52 AM

## 2018-10-22 ENCOUNTER — Ambulatory Visit: Payer: Medicare HMO | Admitting: Cardiology

## 2018-11-12 NOTE — Progress Notes (Signed)
Cardiology Office Note   Date:  11/19/2018   ID:  Maurice Barron, DOB 10/27/1945, MRN [id number]  PCP:  [name], MD  Cardiologist: Dr. Acie Fredrickson, MD   Chief Complaint  Patient presents with  . Follow-up  . Shortness of Breath    History of Present Illness: Maurice Barron is a 73 y.o. male with a hx of CAD, h/o systolic HF w/ improvement in LVEF (initial EF 35%), h/o PVCs, HLD, BPH and recent GI complaints, had left kidney removed approximately 30 yrs ago for multilocular cysts scheduled for elective EGD who was last 09/05/2018 for AF follow up.   Per chart review, he had a remote cath in 2005 that revealed a ruptured plaque in his pLAD, also with moderate irregularitiesinthe other coronary arteries. He was started on Plavix at that time. Echocardiogram showed an initial ejection fraction was around 35%. With good medical therapy, his ejection fractionimprovedto 55%.    He was admitted 07/19/2018 with weakness, multiple falls and near syncope. He was hypotensive and had AKI secondary to reduced PO intake and hypotension. Creatinine was up to 1.9 but came down to 1.52 with treatment and IV hydration. He was having GI complaints in which an EGD showed mild gastritis with biopsies performed. During recovering in endoscopy, he was noted to be in atrial fibrillation w/ RVR but was completely asymptomatic. He was given a dose of esmolol and then started on IV Cardizem drip and converted to NSR shortly thereafter.  Repeat echo on 07/23/18 showed normal systolic function with an EF of 60-65%. No evidence of left ventricular regional wall motion abnormalities. Left atrial size was mildly dilated.  He was placed on a cardiac event monitor from 04/2-04/15 that showed NSR, sinus tachycardia and sinus bradycardia. There was one single 7 beat run of NSVT. No significant arrhythmias to explain episode of syncope. He has had no recurrence of syncope after stopping his Flexeril which he  thought was the culprit.   At last OV, he was fatigued and SOB. He had constant epigastric discomfort but no anginal symptoms however the new SOB was noted to be concerning. Given the new symptoms, he underwent a stress test that was found to be normal with no perfusion defects but with mild global LVEF systolic dysfunction at 09-73%.    Today he reports that his shortness of breath has improved.  In talking with him he states that deconditioning from not exercising played a tremendous level in his previous shortness of breath.  He was previously exercising at the [facility] several times a week and has not been doing much activity since COVID-19.  Cannot wait to get back.  He denies PND, orthopnea, LE swelling, dizziness, diaphoresis or syncope.  He continues to have nominal pain described as tightening at times.  He actually states that exercise improves his symptoms and they worsen with rest.  There is no radiation or associated symptoms.  He is followed by GI and has an upcoming appointment for which he will undergo colonoscopy.  It is unclear the etiology of his symptoms.  He is followed by nephrology for his CKD.  Son-in-law and daughter are both physicians and helps with his care.  He is to retire next week and is excited about having more time to himself.  We discussed his stress test results.  There was a mild decrease in his LV function  (50%) and I am reluctant to repeat his echocardiogram at this time given that he is  experiencing no HF or anginal symptoms at this time.  We discussed that if his shortness of breath returns, we could  repeat echocardiogram.  Overall, he is doing well.   Past Medical History:  Diagnosis Date  . Anxiety   . CHF (congestive heart failure) (HCC)    EF 35%. WITH MEDICATION HIS EF IS NOW 55%  . Coronary artery disease    ruptured plaque in the LAD - started Plavix at that point.  . Diverticulosis   . Enlarged prostate   . Hyperlipidemia   . PVC's (premature  ventricular contractions)   . Renal cyst     Past Surgical History:  Procedure Laterality Date  . BIOPSY  07/22/2018   Procedure: BIOPSY;  Surgeon: Otis Brace, MD;  Location: [place];  Service: Gastroenterology;;  . CARDIAC CATHETERIZATION  12/04/2003   EF 30-35%  . CARDIOVASCULAR STRESS TEST  08/20/2006   EF 50%  . ESOPHAGOGASTRODUODENOSCOPY (EGD) WITH PROPOFOL Left 07/22/2018   Procedure: ESOPHAGOGASTRODUODENOSCOPY (EGD) WITH PROPOFOL;  Surgeon: Otis Brace, MD;  Location: [place];  Service: Gastroenterology;  Laterality: Left;  . HYDROCELE EXCISION / REPAIR    . NEPHRECTOMY    . TONSILLECTOMY    . US ECHOCARDIOGRAPHY  08/07/2005   EF 50-55%     Current Outpatient Medications  Medication Sig Dispense Refill  . amLODipine (NORVASC) 2.5 MG tablet Take 2.5 mg by mouth 2 (two) times daily.    Marland Kitchen apixaban (ELIQUIS) 5 MG TABS tablet Take 1 tablet (5 mg total) by mouth 2 (two) times daily. 60 tablet 11  . buPROPion (WELLBUTRIN) 75 MG tablet Take 150 mg by mouth daily.    . carvedilol (COREG) 25 MG tablet Take 1.5 tablets (37.5 mg total) by mouth 2 (two) times daily with a meal. 90 tablet 0  . Cholecalciferol (VITAMIN D3 PO) Take 1 tablet by mouth at bedtime.    . fish oil-omega-3 fatty acids 1000 MG capsule Take 1 g by mouth at bedtime.     Marland Kitchen loratadine (CLARITIN) 10 MG tablet Take 10 mg by mouth daily as needed (seasonal allergies).    . pantoprazole (PROTONIX) 40 MG tablet Take 40 mg by mouth 2 (two) times daily.    . rosuvastatin (CRESTOR) 40 MG tablet Take 40 mg by mouth at bedtime.      No current facility-administered medications for this visit.     Allergies:   Penicillins    Social History:  The patient  reports that he quit smoking about 26 years ago. He has never used smokeless tobacco. He reports current alcohol use. He reports that he does not use drugs.   Family History:  The patient'sfamily history includes Coronary artery disease in his brother;  Heart attack in his brother and father; Heart disease in his mother; Sarcoidosis in his mother.    ROS:  Please see the history of present illness.   Otherwise, review of systems are positive for none.   All other systems are reviewed and negative.    PHYSICAL EXAM: VS:  BP 124/76   Pulse (!) 59   Ht 5\' 8"  (1.727 m)   Wt 164 lb (74.4 kg)   BMI 24.94 kg/m  , BMI Body mass index is 24.94 kg/m.   General: Well developed, well nourished, NAD Neck: Negative for carotid bruits. No JVD Lungs:Clear to ausculation bilaterally. No wheezes, rales, or rhonchi. Breathing is unlabored. Cardiovascular: RRR with S1 S2. No murmurs, rubs, gallops, or LV heave appreciated. Extremities: No edema. No  clubbing or cyanosis. DP/PT pulses 2+ bilaterally Neuro: Alert and oriented. No focal deficits. No facial asymmetry. MAE spontaneously. Psych: Responds to questions appropriately with normal affect.     EKG:  EKG is ordered today. The ekg ordered today demonstrates sinus bradycardia with HR 59 bpm, no acute changes from prior tracings   Recent Labs: 07/17/2018: ALT 20 07/23/2018: BUN 17; Creatinine, Ser 1.63; Hemoglobin 11.2; Platelets 167; Potassium 4.0; Sodium 136    Lipid Panel No results found for: CHOL, TRIG, HDL, CHOLHDL, VLDL, LDLCALC, LDLDIRECT    Wt Readings from Last 3 Encounters:  11/19/18 164 lb (74.4 kg)  09/25/18 160 lb (72.6 kg)  09/05/18 160 lb (72.6 kg)     Other studies Reviewed: Additional studies/ records that were reviewed today include:  Lexiscan stress test 09/25/2018:  Nuclear stress EF: 50%.  There was no ST segment deviation noted during stress.  No T wave inversion was noted during stress.  The study is normal.  This is a low risk study.  The left ventricular ejection fraction is mildly decreased (45-54%).   Low risk stress nuclear study with normal perfusion, but with mildly reduced global left ventricular systolic function.  ECHO 07/22/2018  1. The  left ventricle has normal systolic function with an ejection fraction of 60-65%. The cavity size was normal. Left ventricular diastolic Doppler parameters are consistent with impaired relaxation. No evidence of left ventricular regional wall  motion abnormalities. 2. The right ventricle has normal systolic function. The cavity was normal. There is no increase in right ventricular wall thickness. Right ventricular systolic pressure could not be assessed. 3. Left atrial size was mildly dilated.   Monitor 08/08/2018:  NSR , sinus tachycardia and sinus bradycardia  There was a single 7 beat run of nonsustained VT .  No significant arrhythmias to explain an episode of syncope    ASSESSMENT AND PLAN:  1. Atrial fibrillation with RVR, post endoscopy procedure: -Denies recurrence although was asymptomatic -Continue Eliquis at 5mg  BID -No reports of acute bleeding in stool or urine -CHA2DS2VASc =4 (age, CHF, vascular disease)  2. Syncope: -No recurrence  -Feels better after being off Flexiril -Stress test 09/25/2018 was normal with normal perfusion and mild global LVEF at 50% -Echocardiogram from 07/2018 stable with normal EF  3. HTN: -Controlled, 124/76 -Continue amlodipine 5 QD  4. Chronic Kidney disease Stage II: -Last creatinine, 1.63 -Will repeat labs today  -Follows with nephrology, PCP  5. Hx of cardiomyopathy: -Initial LVEF 35% with improvement to 60-65% per echo in 07/2018.  -ETT stress test performed secondary to SOB that showed normal perfusion and no defect however EF noted to be mildly decreased at 50% -Continue carvedilol -Reluctant to start additional medication however, could consider losartan if recurrent symptoms along with repeat echocardiogram    Current medicines are reviewed at length with the patient today.  The patient does not have concerns regarding medicines.  The following changes have been made:  no change  Labs/ tests ordered today include:  BMET, CBC, lipid  Orders Placed This Encounter  Procedures  . Basic metabolic panel  . CBC  . Lipid panel  . EKG 12-Lead    Disposition:   FU with Dr. Cathie Olden in 6 months  Signed, Kathyrn Drown, NP  11/19/2018 8:38 AM    [address], [place], [place]  [address] Phone: [phone]; Fax: [phone]

## 2018-11-18 ENCOUNTER — Telehealth: Payer: Self-pay | Admitting: *Deleted

## 2018-11-18 NOTE — Telephone Encounter (Signed)
Called placed to pt re: changing virtual appt to in-office. Pt accepted and has answered no to all following pre-screening questions.        COVID-19 Pre-Screening Questions:  . In the past 7 to 10 days have you had a cough,  shortness of breath, headache, congestion, fever (100 or greater) body aches, chills, sore throat, or sudden loss of taste or sense of smell? . Have you been around anyone with known Covid 19. . Have you been around anyone who is awaiting Covid 19 test results in the past 7 to 10 days? . Have you been around anyone who has been exposed to Covid 19, or has mentioned symptoms of Covid 19 within the past 7 to 10 days?  If you have any concerns/questions about symptoms patients report during screening (either on the phone or at threshold). Contact the provider seeing the patient or DOD for further guidance.  If neither are available contact a member of the leadership team.

## 2018-11-19 ENCOUNTER — Encounter: Payer: Self-pay | Admitting: Cardiology

## 2018-11-19 ENCOUNTER — Encounter (INDEPENDENT_AMBULATORY_CARE_PROVIDER_SITE_OTHER): Payer: Self-pay

## 2018-11-19 ENCOUNTER — Ambulatory Visit (INDEPENDENT_AMBULATORY_CARE_PROVIDER_SITE_OTHER): Payer: Medicare HMO | Admitting: Cardiology

## 2018-11-19 ENCOUNTER — Other Ambulatory Visit: Payer: Self-pay

## 2018-11-19 VITALS — BP 124/76 | HR 59 | Ht 68.0 in | Wt 164.0 lb

## 2018-11-19 DIAGNOSIS — I5022 Chronic systolic (congestive) heart failure: Secondary | ICD-10-CM | POA: Diagnosis not present

## 2018-11-19 DIAGNOSIS — Z79899 Other long term (current) drug therapy: Secondary | ICD-10-CM | POA: Diagnosis not present

## 2018-11-19 DIAGNOSIS — I480 Paroxysmal atrial fibrillation: Secondary | ICD-10-CM | POA: Diagnosis not present

## 2018-11-19 DIAGNOSIS — I10 Essential (primary) hypertension: Secondary | ICD-10-CM | POA: Diagnosis not present

## 2018-11-19 DIAGNOSIS — I25118 Atherosclerotic heart disease of native coronary artery with other forms of angina pectoris: Secondary | ICD-10-CM

## 2018-11-19 LAB — BASIC METABOLIC PANEL
BUN/Creatinine Ratio: 14 (ref 10–24)
BUN: 23 mg/dL (ref 8–27)
CO2: 27 mmol/L (ref 20–29)
Calcium: 9.9 mg/dL (ref 8.6–10.2)
Chloride: 98 mmol/L (ref 96–106)
Creatinine, Ser: 1.69 mg/dL — ABNORMAL HIGH (ref 0.76–1.27)
GFR calc Af Amer: 45 mL/min/{1.73_m2} — ABNORMAL LOW (ref >59)
GFR calc non Af Amer: 39 mL/min/{1.73_m2} — ABNORMAL LOW (ref >59)
Glucose: 88 mg/dL (ref 65–99)
Potassium: 4.8 mmol/L (ref 3.5–5.2)
Sodium: 139 mmol/L (ref 134–144)

## 2018-11-19 LAB — LIPID PANEL
Chol/HDL Ratio: 3.2 ratio (ref 0.0–5.0)
Cholesterol, Total: 132 mg/dL (ref 100–199)
HDL: 41 mg/dL (ref >39)
LDL Calculated: 67 mg/dL (ref 0–99)
Triglycerides: 122 mg/dL (ref 0–149)
VLDL Cholesterol Cal: 24 mg/dL (ref 5–40)

## 2018-11-19 LAB — CBC
Hematocrit: 39.3 % (ref 37.5–51.0)
Hemoglobin: 12.7 g/dL — ABNORMAL LOW (ref 13.0–17.7)
MCH: 27.3 pg (ref 26.6–33.0)
MCHC: 32.3 g/dL (ref 31.5–35.7)
MCV: 84 fL (ref 79–97)
Platelets: 211 10*3/uL (ref 150–450)
RBC: 4.66 x10E6/uL (ref 4.14–5.80)
RDW: 13.7 % (ref 11.6–15.4)
WBC: 7 10*3/uL (ref 3.4–10.8)

## 2018-11-19 NOTE — Patient Instructions (Signed)
Medication Instructions:  Your physician recommends that you continue on your current medications as directed. Please refer to the Current Medication list given to you today.  If you need a refill on your cardiac medications before your next appointment, please call your pharmacy.   Lab work: bmet cbc lipid today   If you have labs (blood work) drawn today and your tests are completely normal, you will receive your results only by: Marland Kitchen MyChart Message (if you have MyChart) OR . A paper copy in the mail If you have any lab test that is abnormal or we need to change your treatment, we will call you to review the results.  Testing/Procedures: NONE ORDERED  TODAY    Follow-Up: At [facility], you and your health needs are our priority.  As part of our continuing mission to provide you with exceptional heart care, we have created designated Provider Care Teams.  These Care Teams include your primary Cardiologist (physician) and Advanced Practice Providers (APPs -  Physician Assistants and Nurse Practitioners) who all work together to provide you with the care you need, when you need it. You will need a follow up appointment in:  6 months.  Please call our office 2 months in advance to schedule this appointment.  You may see Mertie Moores, MD or one of the following Advanced Practice Providers on your designated Care Team: Richardson Dopp, PA-C [place], [place] . Daune Perch, NP  Any Other Special Instructions Will Be Listed Below (If Applicable).

## 2018-12-03 DIAGNOSIS — R1030 Lower abdominal pain, unspecified: Secondary | ICD-10-CM | POA: Diagnosis not present

## 2018-12-03 DIAGNOSIS — D122 Benign neoplasm of ascending colon: Secondary | ICD-10-CM | POA: Diagnosis not present

## 2018-12-03 DIAGNOSIS — K5900 Constipation, unspecified: Secondary | ICD-10-CM | POA: Diagnosis not present

## 2018-12-03 DIAGNOSIS — K648 Other hemorrhoids: Secondary | ICD-10-CM | POA: Diagnosis not present

## 2018-12-03 DIAGNOSIS — K5730 Diverticulosis of large intestine without perforation or abscess without bleeding: Secondary | ICD-10-CM | POA: Diagnosis not present

## 2018-12-03 DIAGNOSIS — D120 Benign neoplasm of cecum: Secondary | ICD-10-CM | POA: Diagnosis not present

## 2018-12-05 DIAGNOSIS — H524 Presbyopia: Secondary | ICD-10-CM | POA: Diagnosis not present

## 2018-12-05 DIAGNOSIS — H2513 Age-related nuclear cataract, bilateral: Secondary | ICD-10-CM | POA: Diagnosis not present

## 2018-12-05 DIAGNOSIS — H16142 Punctate keratitis, left eye: Secondary | ICD-10-CM | POA: Diagnosis not present

## 2018-12-07 DIAGNOSIS — Z0100 Encounter for examination of eyes and vision without abnormal findings: Secondary | ICD-10-CM | POA: Diagnosis not present

## 2018-12-09 DIAGNOSIS — G4733 Obstructive sleep apnea (adult) (pediatric): Secondary | ICD-10-CM | POA: Diagnosis not present

## 2018-12-09 DIAGNOSIS — D122 Benign neoplasm of ascending colon: Secondary | ICD-10-CM | POA: Diagnosis not present

## 2018-12-09 DIAGNOSIS — D120 Benign neoplasm of cecum: Secondary | ICD-10-CM | POA: Diagnosis not present

## 2018-12-30 DIAGNOSIS — D126 Benign neoplasm of colon, unspecified: Secondary | ICD-10-CM | POA: Diagnosis not present

## 2018-12-30 DIAGNOSIS — R251 Tremor, unspecified: Secondary | ICD-10-CM | POA: Diagnosis not present

## 2018-12-30 DIAGNOSIS — I130 Hypertensive heart and chronic kidney disease with heart failure and stage 1 through stage 4 chronic kidney disease, or unspecified chronic kidney disease: Secondary | ICD-10-CM | POA: Diagnosis not present

## 2018-12-30 DIAGNOSIS — N401 Enlarged prostate with lower urinary tract symptoms: Secondary | ICD-10-CM | POA: Diagnosis not present

## 2018-12-30 DIAGNOSIS — I951 Orthostatic hypotension: Secondary | ICD-10-CM | POA: Diagnosis not present

## 2018-12-30 DIAGNOSIS — R5383 Other fatigue: Secondary | ICD-10-CM | POA: Diagnosis not present

## 2018-12-30 DIAGNOSIS — R109 Unspecified abdominal pain: Secondary | ICD-10-CM | POA: Diagnosis not present

## 2019-01-03 DIAGNOSIS — Z23 Encounter for immunization: Secondary | ICD-10-CM | POA: Diagnosis not present

## 2019-02-11 DIAGNOSIS — I2510 Atherosclerotic heart disease of native coronary artery without angina pectoris: Secondary | ICD-10-CM | POA: Diagnosis not present

## 2019-02-11 DIAGNOSIS — N2581 Secondary hyperparathyroidism of renal origin: Secondary | ICD-10-CM | POA: Diagnosis not present

## 2019-02-11 DIAGNOSIS — N182 Chronic kidney disease, stage 2 (mild): Secondary | ICD-10-CM | POA: Diagnosis not present

## 2019-02-11 DIAGNOSIS — R809 Proteinuria, unspecified: Secondary | ICD-10-CM | POA: Diagnosis not present

## 2019-02-11 DIAGNOSIS — E785 Hyperlipidemia, unspecified: Secondary | ICD-10-CM | POA: Diagnosis not present

## 2019-02-11 DIAGNOSIS — Z905 Acquired absence of kidney: Secondary | ICD-10-CM | POA: Diagnosis not present

## 2019-03-10 DIAGNOSIS — G4733 Obstructive sleep apnea (adult) (pediatric): Secondary | ICD-10-CM | POA: Diagnosis not present

## 2019-03-18 DIAGNOSIS — N403 Nodular prostate with lower urinary tract symptoms: Secondary | ICD-10-CM | POA: Diagnosis not present

## 2019-03-20 DIAGNOSIS — R55 Syncope and collapse: Secondary | ICD-10-CM | POA: Diagnosis not present

## 2019-03-20 DIAGNOSIS — R5383 Other fatigue: Secondary | ICD-10-CM | POA: Diagnosis not present

## 2019-03-20 DIAGNOSIS — I2510 Atherosclerotic heart disease of native coronary artery without angina pectoris: Secondary | ICD-10-CM | POA: Diagnosis not present

## 2019-03-20 DIAGNOSIS — G4733 Obstructive sleep apnea (adult) (pediatric): Secondary | ICD-10-CM | POA: Diagnosis not present

## 2019-03-20 DIAGNOSIS — N1831 Chronic kidney disease, stage 3a: Secondary | ICD-10-CM | POA: Diagnosis not present

## 2019-03-20 DIAGNOSIS — N401 Enlarged prostate with lower urinary tract symptoms: Secondary | ICD-10-CM | POA: Diagnosis not present

## 2019-03-20 DIAGNOSIS — I130 Hypertensive heart and chronic kidney disease with heart failure and stage 1 through stage 4 chronic kidney disease, or unspecified chronic kidney disease: Secondary | ICD-10-CM | POA: Diagnosis not present

## 2019-03-25 DIAGNOSIS — N403 Nodular prostate with lower urinary tract symptoms: Secondary | ICD-10-CM | POA: Diagnosis not present

## 2019-03-25 DIAGNOSIS — R350 Frequency of micturition: Secondary | ICD-10-CM | POA: Diagnosis not present

## 2019-03-25 DIAGNOSIS — R3121 Asymptomatic microscopic hematuria: Secondary | ICD-10-CM | POA: Diagnosis not present

## 2019-05-19 ENCOUNTER — Encounter: Payer: Self-pay | Admitting: Cardiovascular Disease

## 2019-05-19 NOTE — Progress Notes (Signed)
Cardiology Office Note   Date:  05/19/2019   ID:  Maurice Barron, DOB November 05, 1945, MRN [id number]  PCP:  [name], MD  Cardiologist:   Mertie Moores, MD   Chief Complaint  Patient presents with  . Congestive Heart Failure   1. Coronary artery disease- mild irregularities including a plaque rupture in the LAD, treated medically 2. Chronic systolic congestive heart failure 3.  Hyperlipidemia   Previous Notes:   Maurice Barron is a middle-aged gentleman with a history of coronary artery disease and congestive heart failure. Had a heart catheterization in 2005. During the heart catheterization he was found to have a ruptured plaque in his proximal left anterior descending artery. He had moderate irregularities and the other coronary arteries. He was started on Plavix at that time.  He's had a history of congestive heart failure. His initial ejection fraction was around 35%. With good medical therapy, his ejection fraction has now increased to 55%. He is no longer having any episodes of chest pain or shortness of breath.  He complains of having lots of bleeding and bruising on the Plavix. He would like to stop it. He's been exercising on an intermittent basis.  September 02, 2013:  Maurice Barron is doing well. He was last seen 3 years ago. He has been on Plavix for 10 years for ruptured plaque in its proximal LAD. [facility].    September 02, 2014:  Maurice Barron is a 74 y.o. male who presents for  Follow up of his CHF.  Still working out regularly.  Goes to the [facility].  Sep 07, 2015:  Maurice Barron is seen today for follow up of his chronic systolic CHF , mild CAD and hyperlipidemia Still goes to the [facility] 3 days a week.   His EF has now been normal for the past several years. ( although he has not had an echo in years.   Jan. 21, 2021 Maurice Barron is seen back after 3 year absence Has a hx of CHF He has been seen by Kathyrn Drown, NP in July , 2020  He was  admitted in March, 2020 with weakness and multiple falls and near syncope.  He was hypotensive and had AKI secondary to reduced p.o. intake.  He had an endoscopy and following endoscopy he was noted to be in atrial fibrillation with rapid ventricular response.  He was completely asymptomatic.  He was given a dose of the esmolol and started on IV Cardizem.  He converted to a  normal sinus rhythm shortly thereafter.  Repeat echocardiogram on March, 2020 revealed normal left ventricular systolic function.  6-8 months ago ,  Started getting pressure in his upper abd and lower chest .  Still has it. Has decreased in intensity and in frequency and duration . Almost no pain currently .   Worse with sitting for a prolonged time . Is not related to exercise.   Is back at the [facility] and these pains are not related to exertion .     CTs of the abdomen and lower chest have been unremarkable.  He has had upper and lower endoscopy without any specific diagnosis to explain his chest pain.  He did develop transient atrial fibrillation during his upper endoscopy and was started on Eliquis at that time.  14-day outpatient telemetry monitor revealed normal sinus rhythm his rhythm and episodes of sinus tachycardia and sinus bradycardia.  He had a single nonsignificant 7 beat run  of nonsustained VT.  There were no significant arrhythmias to explain any syncope or abdominal/chest pain.  Echo on July 22, 2018  Shows normal LV function with EF of 60-65%.   Feeling better.     Past Medical History:  Diagnosis Date  . Anxiety   . CHF (congestive heart failure) (HCC)    EF 35%. WITH MEDICATION HIS EF IS NOW 55%  . Coronary artery disease    ruptured plaque in the LAD - started Plavix at that point.  . Diverticulosis   . Enlarged prostate   . Hyperlipidemia   . PVC's (premature ventricular contractions)   . Renal cyst     Past Surgical History:  Procedure Laterality Date  . BIOPSY  07/22/2018   Procedure: BIOPSY;   Surgeon: Otis Brace, MD;  Location: [place];  Service: Gastroenterology;;  . CARDIAC CATHETERIZATION  12/04/2003   EF 30-35%  . CARDIOVASCULAR STRESS TEST  08/20/2006   EF 50%  . ESOPHAGOGASTRODUODENOSCOPY (EGD) WITH PROPOFOL Left 07/22/2018   Procedure: ESOPHAGOGASTRODUODENOSCOPY (EGD) WITH PROPOFOL;  Surgeon: Otis Brace, MD;  Location: [place];  Service: Gastroenterology;  Laterality: Left;  . HYDROCELE EXCISION / REPAIR    . NEPHRECTOMY    . TONSILLECTOMY    . US ECHOCARDIOGRAPHY  08/07/2005   EF 50-55%     Current Outpatient Medications  Medication Sig Dispense Refill  . amLODipine (NORVASC) 2.5 MG tablet Take 2.5 mg by mouth 2 (two) times daily.    Marland Kitchen apixaban (ELIQUIS) 5 MG TABS tablet Take 1 tablet (5 mg total) by mouth 2 (two) times daily. 60 tablet 11  . buPROPion (WELLBUTRIN) 75 MG tablet Take 150 mg by mouth daily.    . carvedilol (COREG) 25 MG tablet Take 1.5 tablets (37.5 mg total) by mouth 2 (two) times daily with a meal. 90 tablet 0  . Cholecalciferol (VITAMIN D3 PO) Take 1 tablet by mouth at bedtime.    . fish oil-omega-3 fatty acids 1000 MG capsule Take 1 g by mouth at bedtime.     Marland Kitchen loratadine (CLARITIN) 10 MG tablet Take 10 mg by mouth daily as needed (seasonal allergies).    . pantoprazole (PROTONIX) 40 MG tablet Take 40 mg by mouth 2 (two) times daily.    . rosuvastatin (CRESTOR) 40 MG tablet Take 40 mg by mouth at bedtime.      No current facility-administered medications for this visit.    Allergies:   Penicillins    Social History:  The patient  reports that he quit smoking about 27 years ago. He has never used smokeless tobacco. He reports current alcohol use. He reports that he does not use drugs.   Family History:  The patient's family history includes Coronary artery disease in his brother; Heart attack in his brother and father; Heart disease in his mother; Sarcoidosis in his mother.    ROS:  Please see the history of present  illness.        All other systems are reviewed and negative.   Physical Exam: There were no vitals taken for this visit.  GEN:   Elderly male,  NAD  HEENT: Normal NECK: No JVD; No carotid bruits LYMPHATICS: No lymphadenopathy CARDIAC: RRR , no murmurs, rubs, gallops RESPIRATORY:  Clear to auscultation without rales, wheezing or rhonchi  ABDOMEN: Soft, non-tender, non-distended MUSCULOSKELETAL:  No edema; No deformity  SKIN: Warm and dry NEUROLOGIC:  Alert and oriented x 3    EKG:     Recent Labs: 07/17/2018: ALT  20 11/19/2018: BUN 23; Creatinine, Ser 1.69; Hemoglobin 12.7; Platelets 211; Potassium 4.8; Sodium 139    Lipid Panel    Component Value Date/Time   CHOL 132 11/19/2018 0906   TRIG 122 11/19/2018 0906   HDL 41 11/19/2018 0906   CHOLHDL 3.2 11/19/2018 0906   LDLCALC 67 11/19/2018 0906      Wt Readings from Last 3 Encounters:  11/19/18 164 lb (74.4 kg)  09/25/18 160 lb (72.6 kg)  09/05/18 160 lb (72.6 kg)      Other studies Reviewed: Additional studies/ records that were reviewed today include: . Review of the above records demonstrates:    ASSESSMENT AND PLAN:  1.  Upper abdominal/lower chest pain.  Boots has had episodes of this upper abdominal and lower chest pain for the past 6 or 8 months.  In general these pains have been gradually improving.  He has had a very thorough work-up including CTs of the chest abdomen and pelvis.  He has had upper and lower endoscopy.  He has had an echocardiogram that reveals normal left ventricular systolic function.  He has had a whole event monitor that was unremarkable.  He did have a very brief episode of nonsustained ventricular tachycardia which is not significant.  He has had a normal Myoview study.  These episodes are not related to exertion.  He still goes to the [facility] and exercises on a regular basis without any worsening of the symptoms.  At this point I am not sure the exact etiology but it does not appear  to be cardiac in origin.  We will continue with her same medications.  I will have him return to see Kathyrn Drown, NP in 1 year.  We will see him sooner if needed.  2. Coronary artery disease- no recent angina.   I do not think his recent episodes are related to angina   3. Chronic systolic congestive heart failure-  EF has normalized,   4.   Hyperlipidemia - cont meds.   5.  Paroxysmal Atrial fib:   CHA2DS2VASc =4 (age, CHF, vascular disease) Cont eliquis   Current medicines are reviewed at length with the patient today.  The patient does not have concerns regarding medicines.  The following changes have been made:  no change  Labs/ tests ordered today include: No orders of the defined types were placed in this encounter.    Disposition:   FU with me in 1 year     Signed, Mertie Moores, MD  05/19/2019 6:49 PM    [address], [place],   [address] Phone: [phone]; Fax: [phone]

## 2019-05-20 ENCOUNTER — Encounter: Payer: Self-pay | Admitting: Cardiovascular Disease

## 2019-05-20 ENCOUNTER — Other Ambulatory Visit: Payer: Self-pay

## 2019-05-20 ENCOUNTER — Ambulatory Visit (INDEPENDENT_AMBULATORY_CARE_PROVIDER_SITE_OTHER): Payer: Medicare HMO | Admitting: Cardiovascular Disease

## 2019-05-20 VITALS — BP 124/78 | HR 56 | Ht 68.0 in | Wt 173.4 lb

## 2019-05-20 DIAGNOSIS — E782 Mixed hyperlipidemia: Secondary | ICD-10-CM | POA: Diagnosis not present

## 2019-05-20 DIAGNOSIS — I5022 Chronic systolic (congestive) heart failure: Secondary | ICD-10-CM

## 2019-05-20 DIAGNOSIS — I2542 Coronary artery dissection: Secondary | ICD-10-CM | POA: Insufficient documentation

## 2019-05-20 NOTE — Patient Instructions (Addendum)
  Medication Instructions:  Your physician recommends that you continue on your current medications as directed. Please refer to the Current Medication list given to you today.  *If you need a refill on your cardiac medications before your next appointment, please call your pharmacy*  Lab Work: None Ordered  If you have labs (blood work) drawn today and your tests are completely normal, you will receive your results only by: Marland Kitchen MyChart Message (if you have MyChart) OR . A paper copy in the mail If you have any lab test that is abnormal or we need to change your treatment, we will call you to review the results.   Testing/Procedures: None Ordered   Follow-Up: At [facility], you and your health needs are our priority.  As part of our continuing mission to provide you with exceptional heart care, we have created designated Provider Care Teams.  These Care Teams include your primary Cardiologist (physician) and Advanced Practice Providers (APPs -  Physician Assistants and Nurse Practitioners) who all work together to provide you with the care you need, when you need it.  Your next appointment:   1 year(s)  The format for your next appointment:   In Person  Provider:   Kathyrn Drown, NP

## 2019-06-02 ENCOUNTER — Ambulatory Visit: Payer: Medicare HMO | Attending: Internal Medicine

## 2019-06-02 DIAGNOSIS — Z23 Encounter for immunization: Secondary | ICD-10-CM | POA: Insufficient documentation

## 2019-06-02 NOTE — Progress Notes (Signed)
   Covid-19 Vaccination Clinic  Name:  Maurice Barron [facility]    MRN: [id number] DOB: 1946-03-12  06/02/2019  Mr. Maurice Barron was observed post Covid-19 immunization for 15 minutes without incidence. He was provided with Vaccine Information Sheet and instruction to access the V-Safe system.   Mr. Maurice Barron was instructed to call 911 with any severe reactions post vaccine: Marland Kitchen Difficulty breathing  . Swelling of your face and throat  . A fast heartbeat  . A bad rash all over your body  . Dizziness and weakness    Immunizations Administered    Name Date Dose VIS Date Route   Pfizer COVID-19 Vaccine 06/02/2019  9:02 AM 0.3 mL 04/18/2019 Intramuscular   Manufacturer: [place]   Lot: [id number]   [place]: [id number]

## 2019-06-23 ENCOUNTER — Ambulatory Visit: Payer: Medicare HMO | Attending: Internal Medicine

## 2019-06-23 DIAGNOSIS — Z23 Encounter for immunization: Secondary | ICD-10-CM | POA: Insufficient documentation

## 2019-06-23 NOTE — Progress Notes (Signed)
   Covid-19 Vaccination Clinic  Name:  Araf Klahr [facility]    MRN: [id number] DOB: 17-Jul-1945  06/23/2019  Mr. Sitzman was observed post Covid-19 immunization for 15 minutes without incidence. He was provided with Vaccine Information Sheet and instruction to access the V-Safe system.   Mr. Cardullo was instructed to call 911 with any severe reactions post vaccine: Marland Kitchen Difficulty breathing  . Swelling of your face and throat  . A fast heartbeat  . A bad rash all over your body  . Dizziness and weakness    Immunizations Administered    Name Date Dose VIS Date Route   Pfizer COVID-19 Vaccine 06/23/2019  8:50 AM 0.3 mL 04/18/2019 Intramuscular   Manufacturer: Reserve   Lot: [id number]   [place]: [id number]

## 2019-07-14 ENCOUNTER — Other Ambulatory Visit: Payer: Self-pay

## 2019-07-14 MED ORDER — APIXABAN 5 MG PO TABS: 5.0000 mg | ORAL_TABLET | Freq: Two times a day (BID) | ORAL | 1 refills | Status: DC

## 2019-07-18 DIAGNOSIS — E7849 Other hyperlipidemia: Secondary | ICD-10-CM | POA: Diagnosis not present

## 2019-07-18 DIAGNOSIS — Z125 Encounter for screening for malignant neoplasm of prostate: Secondary | ICD-10-CM | POA: Diagnosis not present

## 2019-07-18 DIAGNOSIS — E559 Vitamin D deficiency, unspecified: Secondary | ICD-10-CM | POA: Diagnosis not present

## 2019-07-18 DIAGNOSIS — M109 Gout, unspecified: Secondary | ICD-10-CM | POA: Diagnosis not present

## 2019-07-18 DIAGNOSIS — Z0000 Encounter for general adult medical examination without abnormal findings: Secondary | ICD-10-CM | POA: Diagnosis not present

## 2019-07-25 DIAGNOSIS — I130 Hypertensive heart and chronic kidney disease with heart failure and stage 1 through stage 4 chronic kidney disease, or unspecified chronic kidney disease: Secondary | ICD-10-CM | POA: Diagnosis not present

## 2019-07-25 DIAGNOSIS — Z0000 Encounter for general adult medical examination without abnormal findings: Secondary | ICD-10-CM | POA: Diagnosis not present

## 2019-07-25 DIAGNOSIS — E785 Hyperlipidemia, unspecified: Secondary | ICD-10-CM | POA: Diagnosis not present

## 2019-07-25 DIAGNOSIS — R4702 Dysphasia: Secondary | ICD-10-CM | POA: Diagnosis not present

## 2019-07-25 DIAGNOSIS — G4733 Obstructive sleep apnea (adult) (pediatric): Secondary | ICD-10-CM | POA: Diagnosis not present

## 2019-07-25 DIAGNOSIS — R82998 Other abnormal findings in urine: Secondary | ICD-10-CM | POA: Diagnosis not present

## 2019-07-25 DIAGNOSIS — I509 Heart failure, unspecified: Secondary | ICD-10-CM | POA: Diagnosis not present

## 2019-07-25 DIAGNOSIS — I4891 Unspecified atrial fibrillation: Secondary | ICD-10-CM | POA: Diagnosis not present

## 2019-07-25 DIAGNOSIS — Z1331 Encounter for screening for depression: Secondary | ICD-10-CM | POA: Diagnosis not present

## 2019-07-25 DIAGNOSIS — I2510 Atherosclerotic heart disease of native coronary artery without angina pectoris: Secondary | ICD-10-CM | POA: Diagnosis not present

## 2019-07-25 DIAGNOSIS — N1831 Chronic kidney disease, stage 3a: Secondary | ICD-10-CM | POA: Diagnosis not present

## 2019-09-02 DIAGNOSIS — Z905 Acquired absence of kidney: Secondary | ICD-10-CM | POA: Diagnosis not present

## 2019-09-02 DIAGNOSIS — N182 Chronic kidney disease, stage 2 (mild): Secondary | ICD-10-CM | POA: Diagnosis not present

## 2019-09-02 DIAGNOSIS — R809 Proteinuria, unspecified: Secondary | ICD-10-CM | POA: Diagnosis not present

## 2019-09-02 DIAGNOSIS — I129 Hypertensive chronic kidney disease with stage 1 through stage 4 chronic kidney disease, or unspecified chronic kidney disease: Secondary | ICD-10-CM | POA: Diagnosis not present

## 2019-09-02 DIAGNOSIS — N2581 Secondary hyperparathyroidism of renal origin: Secondary | ICD-10-CM | POA: Diagnosis not present

## 2019-09-02 DIAGNOSIS — N183 Chronic kidney disease, stage 3 unspecified: Secondary | ICD-10-CM | POA: Diagnosis not present

## 2019-12-09 DIAGNOSIS — I2510 Atherosclerotic heart disease of native coronary artery without angina pectoris: Secondary | ICD-10-CM | POA: Diagnosis not present

## 2019-12-09 DIAGNOSIS — R4702 Dysphasia: Secondary | ICD-10-CM | POA: Diagnosis not present

## 2019-12-09 DIAGNOSIS — I130 Hypertensive heart and chronic kidney disease with heart failure and stage 1 through stage 4 chronic kidney disease, or unspecified chronic kidney disease: Secondary | ICD-10-CM | POA: Diagnosis not present

## 2019-12-09 DIAGNOSIS — N1831 Chronic kidney disease, stage 3a: Secondary | ICD-10-CM | POA: Diagnosis not present

## 2019-12-09 DIAGNOSIS — I4891 Unspecified atrial fibrillation: Secondary | ICD-10-CM | POA: Diagnosis not present

## 2020-01-10 ENCOUNTER — Other Ambulatory Visit: Payer: Self-pay | Admitting: Cardiovascular Disease

## 2020-01-21 DIAGNOSIS — G4733 Obstructive sleep apnea (adult) (pediatric): Secondary | ICD-10-CM | POA: Diagnosis not present

## 2020-02-26 DIAGNOSIS — I10 Essential (primary) hypertension: Secondary | ICD-10-CM | POA: Diagnosis not present

## 2020-02-26 DIAGNOSIS — E7800 Pure hypercholesterolemia, unspecified: Secondary | ICD-10-CM | POA: Diagnosis not present

## 2020-02-26 DIAGNOSIS — Z0100 Encounter for examination of eyes and vision without abnormal findings: Secondary | ICD-10-CM | POA: Diagnosis not present

## 2020-02-26 DIAGNOSIS — H524 Presbyopia: Secondary | ICD-10-CM | POA: Diagnosis not present

## 2020-03-09 DIAGNOSIS — I129 Hypertensive chronic kidney disease with stage 1 through stage 4 chronic kidney disease, or unspecified chronic kidney disease: Secondary | ICD-10-CM | POA: Diagnosis not present

## 2020-03-09 DIAGNOSIS — N2581 Secondary hyperparathyroidism of renal origin: Secondary | ICD-10-CM | POA: Diagnosis not present

## 2020-03-09 DIAGNOSIS — R809 Proteinuria, unspecified: Secondary | ICD-10-CM | POA: Diagnosis not present

## 2020-03-09 DIAGNOSIS — N183 Chronic kidney disease, stage 3 unspecified: Secondary | ICD-10-CM | POA: Diagnosis not present

## 2020-03-09 DIAGNOSIS — Z905 Acquired absence of kidney: Secondary | ICD-10-CM | POA: Diagnosis not present

## 2020-03-16 DIAGNOSIS — N403 Nodular prostate with lower urinary tract symptoms: Secondary | ICD-10-CM | POA: Diagnosis not present

## 2020-03-23 DIAGNOSIS — N281 Cyst of kidney, acquired: Secondary | ICD-10-CM | POA: Diagnosis not present

## 2020-03-23 DIAGNOSIS — R3121 Asymptomatic microscopic hematuria: Secondary | ICD-10-CM | POA: Diagnosis not present

## 2020-03-23 DIAGNOSIS — R350 Frequency of micturition: Secondary | ICD-10-CM | POA: Diagnosis not present

## 2020-03-23 DIAGNOSIS — Z905 Acquired absence of kidney: Secondary | ICD-10-CM | POA: Diagnosis not present

## 2020-03-23 DIAGNOSIS — N403 Nodular prostate with lower urinary tract symptoms: Secondary | ICD-10-CM | POA: Diagnosis not present

## 2020-05-27 DIAGNOSIS — I4891 Unspecified atrial fibrillation: Secondary | ICD-10-CM | POA: Diagnosis not present

## 2020-05-27 DIAGNOSIS — N1831 Chronic kidney disease, stage 3a: Secondary | ICD-10-CM | POA: Diagnosis not present

## 2020-05-27 DIAGNOSIS — I2510 Atherosclerotic heart disease of native coronary artery without angina pectoris: Secondary | ICD-10-CM | POA: Diagnosis not present

## 2020-05-27 DIAGNOSIS — N401 Enlarged prostate with lower urinary tract symptoms: Secondary | ICD-10-CM | POA: Diagnosis not present

## 2020-05-27 DIAGNOSIS — I130 Hypertensive heart and chronic kidney disease with heart failure and stage 1 through stage 4 chronic kidney disease, or unspecified chronic kidney disease: Secondary | ICD-10-CM | POA: Diagnosis not present

## 2020-05-27 DIAGNOSIS — R4702 Dysphasia: Secondary | ICD-10-CM | POA: Diagnosis not present

## 2020-06-04 ENCOUNTER — Other Ambulatory Visit: Payer: Self-pay

## 2020-06-04 ENCOUNTER — Encounter: Payer: Self-pay | Admitting: Cardiovascular Disease

## 2020-06-04 ENCOUNTER — Ambulatory Visit: Payer: Medicare HMO | Admitting: Cardiovascular Disease

## 2020-06-04 VITALS — BP 126/66 | HR 62 | Ht 68.0 in | Wt 183.6 lb

## 2020-06-04 DIAGNOSIS — E782 Mixed hyperlipidemia: Secondary | ICD-10-CM | POA: Diagnosis not present

## 2020-06-04 DIAGNOSIS — I10 Essential (primary) hypertension: Secondary | ICD-10-CM

## 2020-06-04 DIAGNOSIS — I480 Paroxysmal atrial fibrillation: Secondary | ICD-10-CM

## 2020-06-04 DIAGNOSIS — I5022 Chronic systolic (congestive) heart failure: Secondary | ICD-10-CM | POA: Diagnosis not present

## 2020-06-04 NOTE — Patient Instructions (Signed)

## 2020-06-04 NOTE — Progress Notes (Signed)
Cardiology Office Note   Date:  06/04/2020   ID:  Maurice Barron, DOB October 07, 1945, MRN [id number]  PCP:  [name], MD  Cardiologist:   Mertie Moores, MD   Chief Complaint  Patient presents with  . Congestive Heart Failure   1. Coronary artery disease- mild irregularities including a plaque rupture in the LAD, treated medically 2. Chronic systolic congestive heart failure 3.  Hyperlipidemia 4.  Atrial fib  - March, 2020   Previous Notes:   Maurice Barron is a middle-aged gentleman with a history of coronary artery disease and congestive heart failure. Had a heart catheterization in 2005. During the heart catheterization he was found to have a ruptured plaque in his proximal left anterior descending artery. He had moderate irregularities and the other coronary arteries. He was started on Plavix at that time.  He's had a history of congestive heart failure. His initial ejection fraction was around 35%. With good medical therapy, his ejection fraction has now increased to 55%. He is no longer having any episodes of chest pain or shortness of breath.  He complains of having lots of bleeding and bruising on the Plavix. He would like to stop it. He's been exercising on an intermittent basis.  September 02, 2013:  Maurice Barron is doing well. He was last seen 3 years ago. He has been on Plavix for 10 years for ruptured plaque in its proximal LAD. [facility].    September 02, 2014:  Maurice Barron is a 75 y.o. male who presents for  Follow up of his CHF.  Still working out regularly.  Goes to the [facility].  Sep 07, 2015:  Maurice Barron is seen today for follow up of his chronic systolic CHF , mild CAD and hyperlipidemia Still goes to the [facility] 3 days a week.   His EF has now been normal for the past several years. ( although he has not had an echo in years.   Jan. 21, 2021 Maurice Barron is seen back after 3 year absence Has a hx of CHF He has been seen by Kathyrn Drown,  NP in July , 2020  He was admitted in March, 2020 with weakness and multiple falls and near syncope.  He was hypotensive and had AKI secondary to reduced p.o. intake.  He had an endoscopy and following endoscopy he was noted to be in atrial fibrillation with rapid ventricular response.  He was completely asymptomatic.  He was given a dose of the esmolol and started on IV Cardizem.  He converted to a  normal sinus rhythm shortly thereafter.  Repeat echocardiogram on March, 2020 revealed normal left ventricular systolic function.  6-8 months ago ,  Started getting pressure in his upper abd and lower chest .  Still has it. Has decreased in intensity and in frequency and duration . Almost no pain currently .   Worse with sitting for a prolonged time . Is not related to exercise.   Is back at the [facility] and these pains are not related to exertion .     CTs of the abdomen and lower chest have been unremarkable.  He has had upper and lower endoscopy without any specific diagnosis to explain his chest pain.  He did develop transient atrial fibrillation during his upper endoscopy and was started on Eliquis at that time.  14-day outpatient telemetry monitor revealed normal sinus rhythm his rhythm and episodes of sinus tachycardia and sinus bradycardia.  He had a single nonsignificant 7 beat run of nonsustained VT.  There were no significant arrhythmias to explain any syncope or abdominal/chest pain.  Echo on July 22, 2018  Shows normal LV function with EF of 60-65%.   Feeling better.    Jan. 28, 2022: Maurice Barron is seen for folllow up of his CHF, CAD, HLD He has mild - mod CKD and has seen Dr. 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  Is not exercising much since he went back to work . No CP or dyspnea with his usual activities    Past Medical History:  Diagnosis Date  . Anxiety   . CHF (congestive heart failure) (HCC)    EF 35%. WITH MEDICATION HIS EF  IS NOW 55%  . Coronary artery disease    ruptured plaque in the LAD - started Plavix at that point.  . Diverticulosis   . Enlarged prostate   . Hyperlipidemia   . PVC's (premature ventricular contractions)   . Renal cyst     Past Surgical History:  Procedure Laterality Date  . BIOPSY  07/22/2018   Procedure: BIOPSY;  Surgeon: Otis Brace, MD;  Location: [place];  Service: Gastroenterology;;  . CARDIAC CATHETERIZATION  12/04/2003   EF 30-35%  . CARDIOVASCULAR STRESS TEST  08/20/2006   EF 50%  . ESOPHAGOGASTRODUODENOSCOPY (EGD) WITH PROPOFOL Left 07/22/2018   Procedure: ESOPHAGOGASTRODUODENOSCOPY (EGD) WITH PROPOFOL;  Surgeon: Otis Brace, MD;  Location: [place];  Service: Gastroenterology;  Laterality: Left;  . HYDROCELE EXCISION / REPAIR    . NEPHRECTOMY    . TONSILLECTOMY    . US ECHOCARDIOGRAPHY  08/07/2005   EF 50-55%     Current Outpatient Medications  Medication Sig Dispense Refill  . amLODipine (NORVASC) 2.5 MG tablet Take 2.5 mg by mouth daily.     Marland Kitchen buPROPion (WELLBUTRIN) 75 MG tablet Take 75 mg by mouth daily.     . carvedilol (COREG) 25 MG tablet Take 25 mg by mouth 2 (two) times daily with a meal.    . Cholecalciferol (VITAMIN D3) 125 MCG (5000 UT) TABS Take 1 tablet by mouth at bedtime.    Marland Kitchen ELIQUIS 5 MG TABS tablet TAKE 1 TABLET BY MOUTH TWICE A DAY 180 tablet 1  . finasteride (PROSCAR) 5 MG tablet Take 5 mg by mouth daily.    . fish oil-omega-3 fatty acids 1000 MG capsule Take 1 g by mouth at bedtime.    . pantoprazole (PROTONIX) 40 MG tablet Take 40 mg by mouth 2 (two) times daily.    . rosuvastatin (CRESTOR) 40 MG tablet Take 40 mg by mouth at bedtime.     No current facility-administered medications for this visit.    Allergies:   Penicillins    Social History:  The patient  reports that he quit smoking about 28 years ago. He has never used smokeless tobacco. He reports current alcohol use. He reports that he does not use drugs.    Family History:  The patient's family history includes Coronary artery disease in his brother; Heart attack in his brother and father; Heart disease in his mother; Sarcoidosis in his mother.    ROS:  Please see the history of present illness.     All other systems are reviewed and negative.   Physical Exam: Blood pressure 126/66, pulse 62, height 5\' 8"  (1.727 m), weight 183 lb 9.6 oz (83.3 kg), SpO2 95 %.  GEN:  Well nourished, well  developed in no acute distress HEENT: Normal NECK: No JVD; No carotid bruits LYMPHATICS: No lymphadenopathy CARDIAC: RRR , no murmurs, rubs, gallops RESPIRATORY:  Clear to auscultation without rales, wheezing or rhonchi  ABDOMEN: Soft, non-tender, non-distended MUSCULOSKELETAL:  No edema; No deformity  SKIN: Warm and dry NEUROLOGIC:  Alert and oriented x 3    EKG:  Jan. 28, 2022:   NSR at 62.  No ST or T wave changes.    Recent Labs: No results found for requested labs within last 8760 hours.    Lipid Panel    Component Value Date/Time   CHOL 132 11/19/2018 0906   TRIG 122 11/19/2018 0906   HDL 41 11/19/2018 0906   CHOLHDL 3.2 11/19/2018 0906   LDLCALC 67 11/19/2018 0906      Wt Readings from Last 3 Encounters:  06/04/20 183 lb 9.6 oz (83.3 kg)  05/20/19 173 lb 6.4 oz (78.7 kg)  11/19/18 164 lb (74.4 kg)      Other studies Reviewed: Additional studies/ records that were reviewed today include: . Review of the above records demonstrates:    ASSESSMENT AND PLAN:  1.  Upper abdominal/lower chest pain.  Is not having any further episodes of chest discomfort. .  2. Coronary artery disease-  Unremarkable stress test May, 2020.  He is not having any angina.  3. Chronic systolic congestive heart failure-   EF has normalized on medical therapy / treatment of his HTN continue current meds.   4.   Hyperlipidemia - managed by Dr. Dagmar Hait   5.  Paroxysmal Atrial fib:   [id number] =4 (age, CHF, vascular disease)  continunue eliquis    Current medicines are reviewed at length with the patient today.  The patient does not have concerns regarding medicines.  The following changes have been made:  no change  Labs/ tests ordered today include: No orders of the defined types were placed in this encounter.    Disposition:   FU with me in 1 year     Signed, Mertie Moores, MD  06/04/2020 3:01 PM    [facility] [place], [place], [place]  [address] Phone: [phone]; Fax: [phone]

## 2020-07-25 ENCOUNTER — Other Ambulatory Visit: Payer: Self-pay | Admitting: Cardiovascular Disease

## 2020-08-16 DIAGNOSIS — E559 Vitamin D deficiency, unspecified: Secondary | ICD-10-CM | POA: Diagnosis not present

## 2020-08-16 DIAGNOSIS — M109 Gout, unspecified: Secondary | ICD-10-CM | POA: Diagnosis not present

## 2020-08-16 DIAGNOSIS — Z125 Encounter for screening for malignant neoplasm of prostate: Secondary | ICD-10-CM | POA: Diagnosis not present

## 2020-08-16 DIAGNOSIS — E785 Hyperlipidemia, unspecified: Secondary | ICD-10-CM | POA: Diagnosis not present

## 2020-08-23 DIAGNOSIS — N1831 Chronic kidney disease, stage 3a: Secondary | ICD-10-CM | POA: Diagnosis not present

## 2020-08-23 DIAGNOSIS — I130 Hypertensive heart and chronic kidney disease with heart failure and stage 1 through stage 4 chronic kidney disease, or unspecified chronic kidney disease: Secondary | ICD-10-CM | POA: Diagnosis not present

## 2020-08-23 DIAGNOSIS — I2510 Atherosclerotic heart disease of native coronary artery without angina pectoris: Secondary | ICD-10-CM | POA: Diagnosis not present

## 2020-08-23 DIAGNOSIS — I4891 Unspecified atrial fibrillation: Secondary | ICD-10-CM | POA: Diagnosis not present

## 2020-08-23 DIAGNOSIS — N401 Enlarged prostate with lower urinary tract symptoms: Secondary | ICD-10-CM | POA: Diagnosis not present

## 2020-08-23 DIAGNOSIS — E785 Hyperlipidemia, unspecified: Secondary | ICD-10-CM | POA: Diagnosis not present

## 2020-08-23 DIAGNOSIS — D692 Other nonthrombocytopenic purpura: Secondary | ICD-10-CM | POA: Diagnosis not present

## 2020-08-23 DIAGNOSIS — R82998 Other abnormal findings in urine: Secondary | ICD-10-CM | POA: Diagnosis not present

## 2020-08-23 DIAGNOSIS — Z0000 Encounter for general adult medical examination without abnormal findings: Secondary | ICD-10-CM | POA: Diagnosis not present

## 2020-09-07 DIAGNOSIS — R809 Proteinuria, unspecified: Secondary | ICD-10-CM | POA: Diagnosis not present

## 2020-09-07 DIAGNOSIS — I129 Hypertensive chronic kidney disease with stage 1 through stage 4 chronic kidney disease, or unspecified chronic kidney disease: Secondary | ICD-10-CM | POA: Diagnosis not present

## 2020-09-07 DIAGNOSIS — N2581 Secondary hyperparathyroidism of renal origin: Secondary | ICD-10-CM | POA: Diagnosis not present

## 2020-09-07 DIAGNOSIS — Z905 Acquired absence of kidney: Secondary | ICD-10-CM | POA: Diagnosis not present

## 2020-09-07 DIAGNOSIS — N183 Chronic kidney disease, stage 3 unspecified: Secondary | ICD-10-CM | POA: Diagnosis not present

## 2020-10-02 DIAGNOSIS — G4733 Obstructive sleep apnea (adult) (pediatric): Secondary | ICD-10-CM | POA: Diagnosis not present

## 2020-11-07 IMAGING — CT CT ABDOMEN AND PELVIS WITHOUT CONTRAST
2 of 4 series · 16 of 46 positions shown, 18 images · non-contrast
Comparison: None.

CLINICAL DATA: Acute onset of generalized abdominal pain and
nausea. Constipation.

EXAM:
CT ABDOMEN AND PELVIS WITHOUT CONTRAST
TECHNIQUE: Multidetector CT imaging of the abdomen and pelvis was performed
following the standard protocol without IV contrast.

[Series 2: axial st · axial · 0.76mm/px · z∈[-459,-54]mm · 13 of 89 slices shown, 15 images]
[im 4/89  soft-tissue]
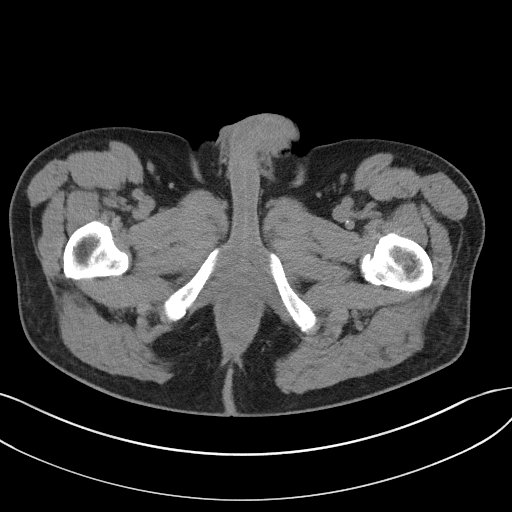
[im 4/89  bone]
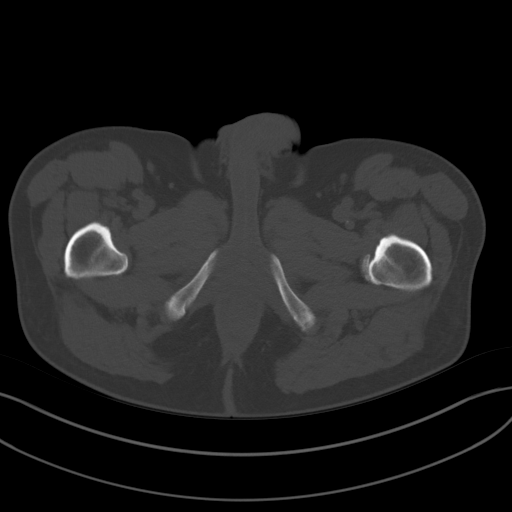
[im 11/89  soft-tissue]
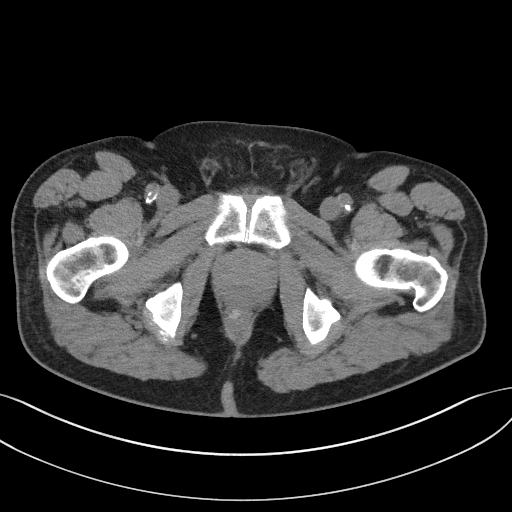
[im 18/89  soft-tissue]
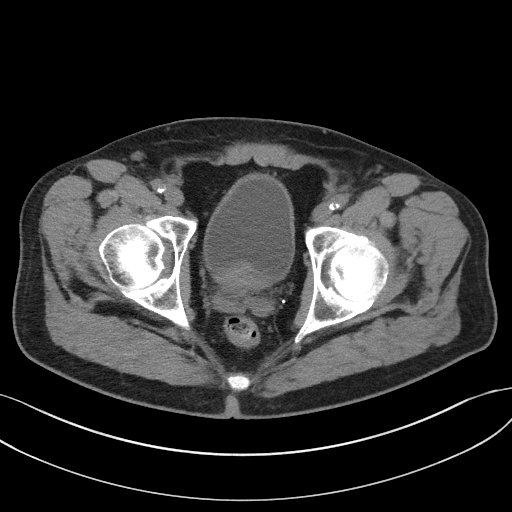
[im 25/89  soft-tissue]
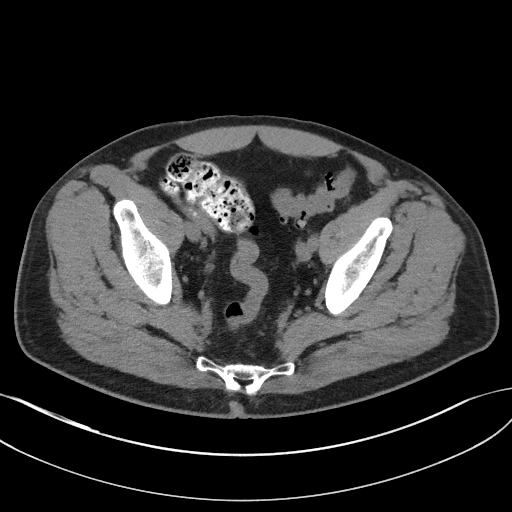
[im 32/89  soft-tissue]
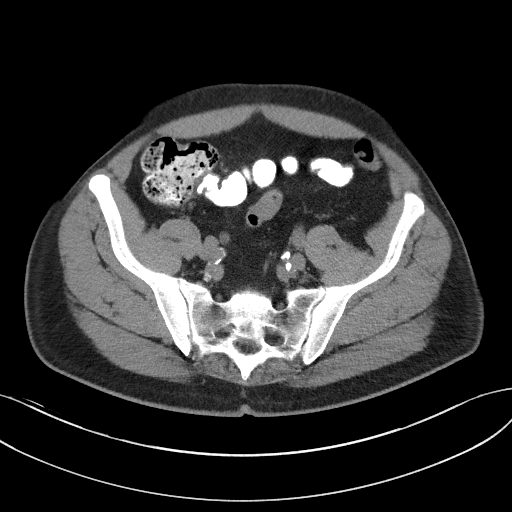
[im 39/89  soft-tissue]
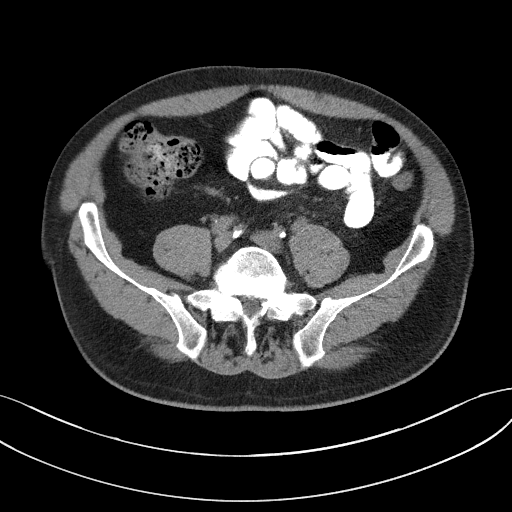
[im 46/89  soft-tissue]
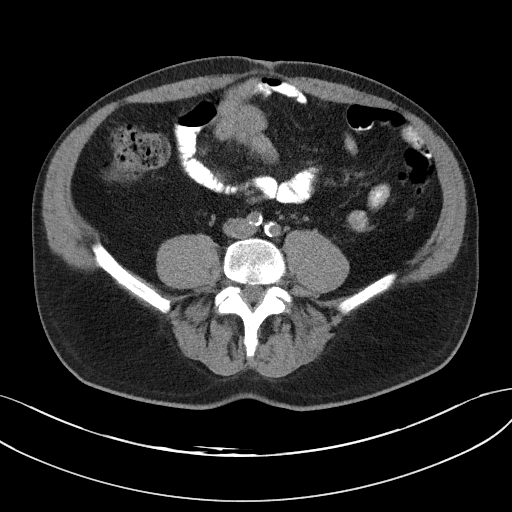
[im 50/89  soft-tissue]
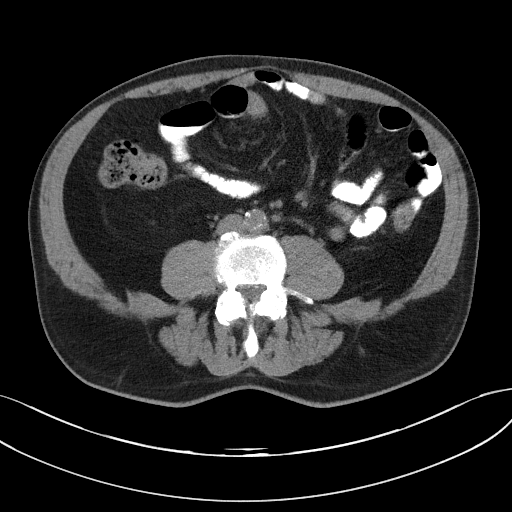
[im 57/89  soft-tissue]
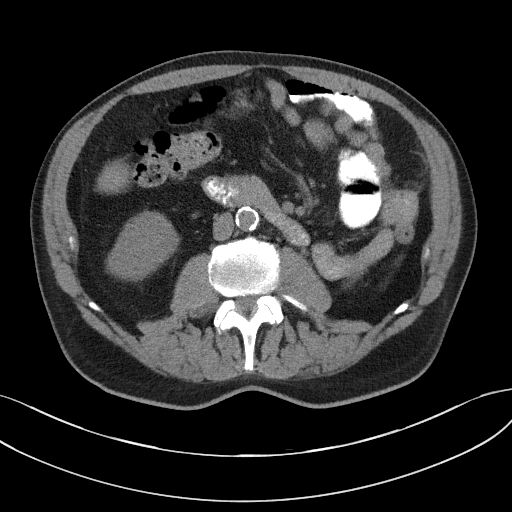
[im 57/89  bone]
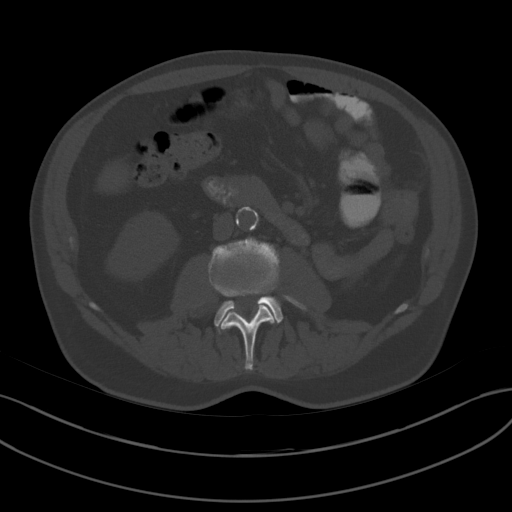
[im 64/89  soft-tissue]
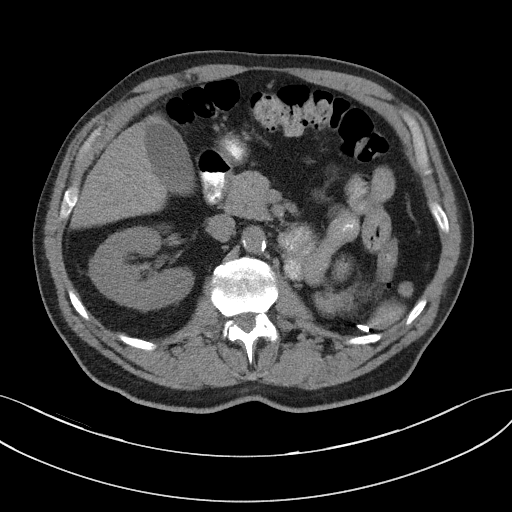
[im 71/89  soft-tissue]
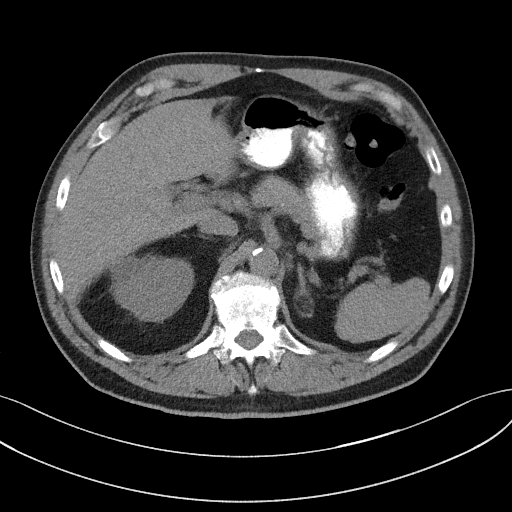
[im 78/89  soft-tissue]
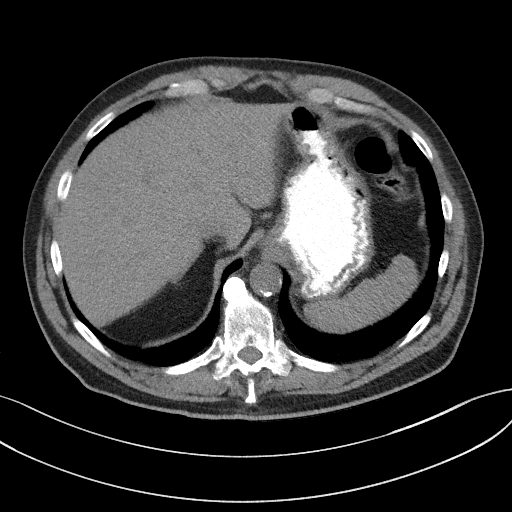
[im 85/89  soft-tissue]
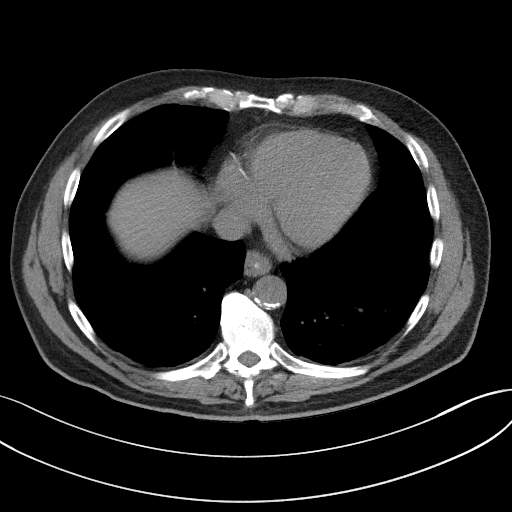

[Series 5: coronal st · coronal · 0.75mm/px · 3 of 93 slices shown]
[im 31/93  soft-tissue]
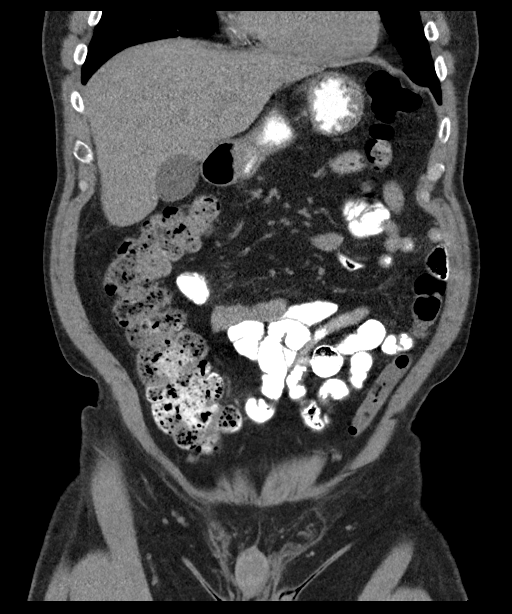
[im 41/93  soft-tissue]
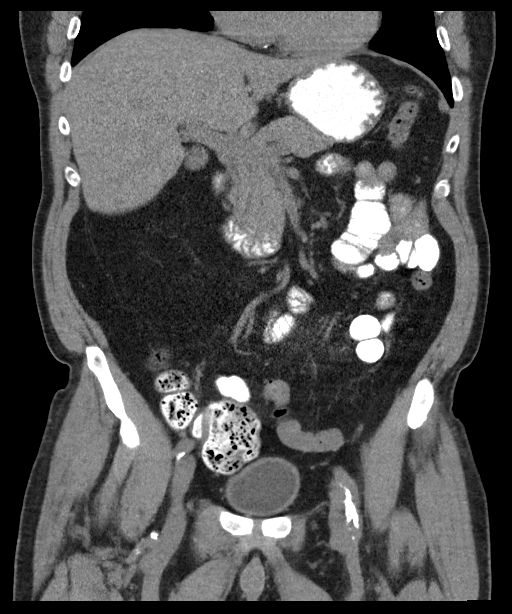
[im 52/93  soft-tissue]
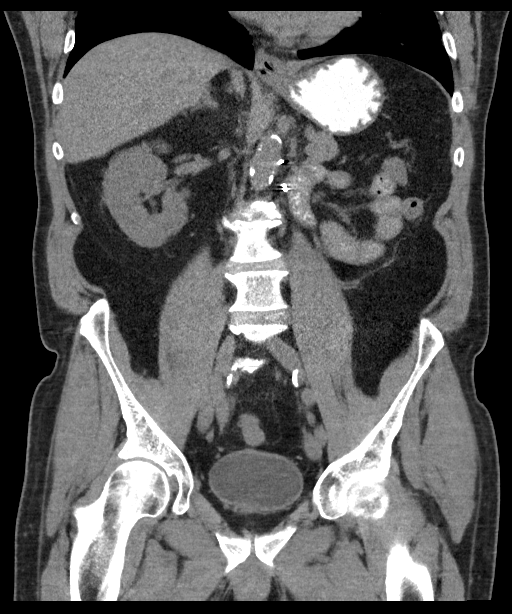

[16 of 46 positions shown; findings below may reference images not displayed]

FINDINGS: Lower chest: Minimal bibasilar scarring is noted. The visualized
lung apices are otherwise clear.

Hepatobiliary: The liver is unremarkable in appearance. The
gallbladder is unremarkable in appearance. The common bile duct
remains normal in caliber.

Pancreas: The pancreas is within normal limits.

Spleen: The spleen is unremarkable in appearance.

Adrenals/Urinary Tract: The adrenal glands are unremarkable.

Mild right-sided perinephric stranding is noted. A small right renal
cyst is noted. The right kidney is otherwise unremarkable. There is
no evidence of hydronephrosis. The patient is status post left-sided
nephrectomy. No renal or ureteral stones are identified.

Stomach/Bowel: The stomach is unremarkable in appearance. The small
bowel is within normal limits. The appendix is normal in caliber,
without evidence of appendicitis. Scattered diverticulosis is noted
along the descending and sigmoid colon, without evidence of
diverticulitis. A small amount of stool is noted in the colon.

Vascular/Lymphatic: Scattered calcification is seen along the
abdominal aorta and its branches. The abdominal aorta is otherwise
grossly unremarkable. The inferior vena cava is grossly
unremarkable. No retroperitoneal lymphadenopathy is seen. No pelvic
sidewall lymphadenopathy is identified.

Reproductive: The bladder is mildly distended. There is impression
on the base of the bladder by the prostate. The prostate is
enlarged, measuring 5.6 cm in transverse dimension.

Other: No additional soft tissue abnormalities are seen.

Musculoskeletal: No acute osseous abnormalities are identified. The
visualized musculature is unremarkable in appearance.
IMPRESSION: 1. No acute abnormality seen to explain the patient's symptoms.
Small amount of stool noted in the colon.
2. Scattered diverticulosis along the descending and sigmoid colon,
without evidence of diverticulitis.
3. Small right renal cyst noted.
4. Enlarged prostate, with impression on the base of the bladder by
the prostate. Would correlate with PSA.

Aortic Atherosclerosis ([id number]-[id number]).

## 2020-11-12 IMAGING — DX PORTABLE CHEST - 1 VIEW
1 series · 1 of 1 positions shown · non-contrast
Comparison: Status post endoscopy.

CLINICAL DATA: Hypoxia.

EXAM:
PORTABLE CHEST 1 VIEW

[chest ap]
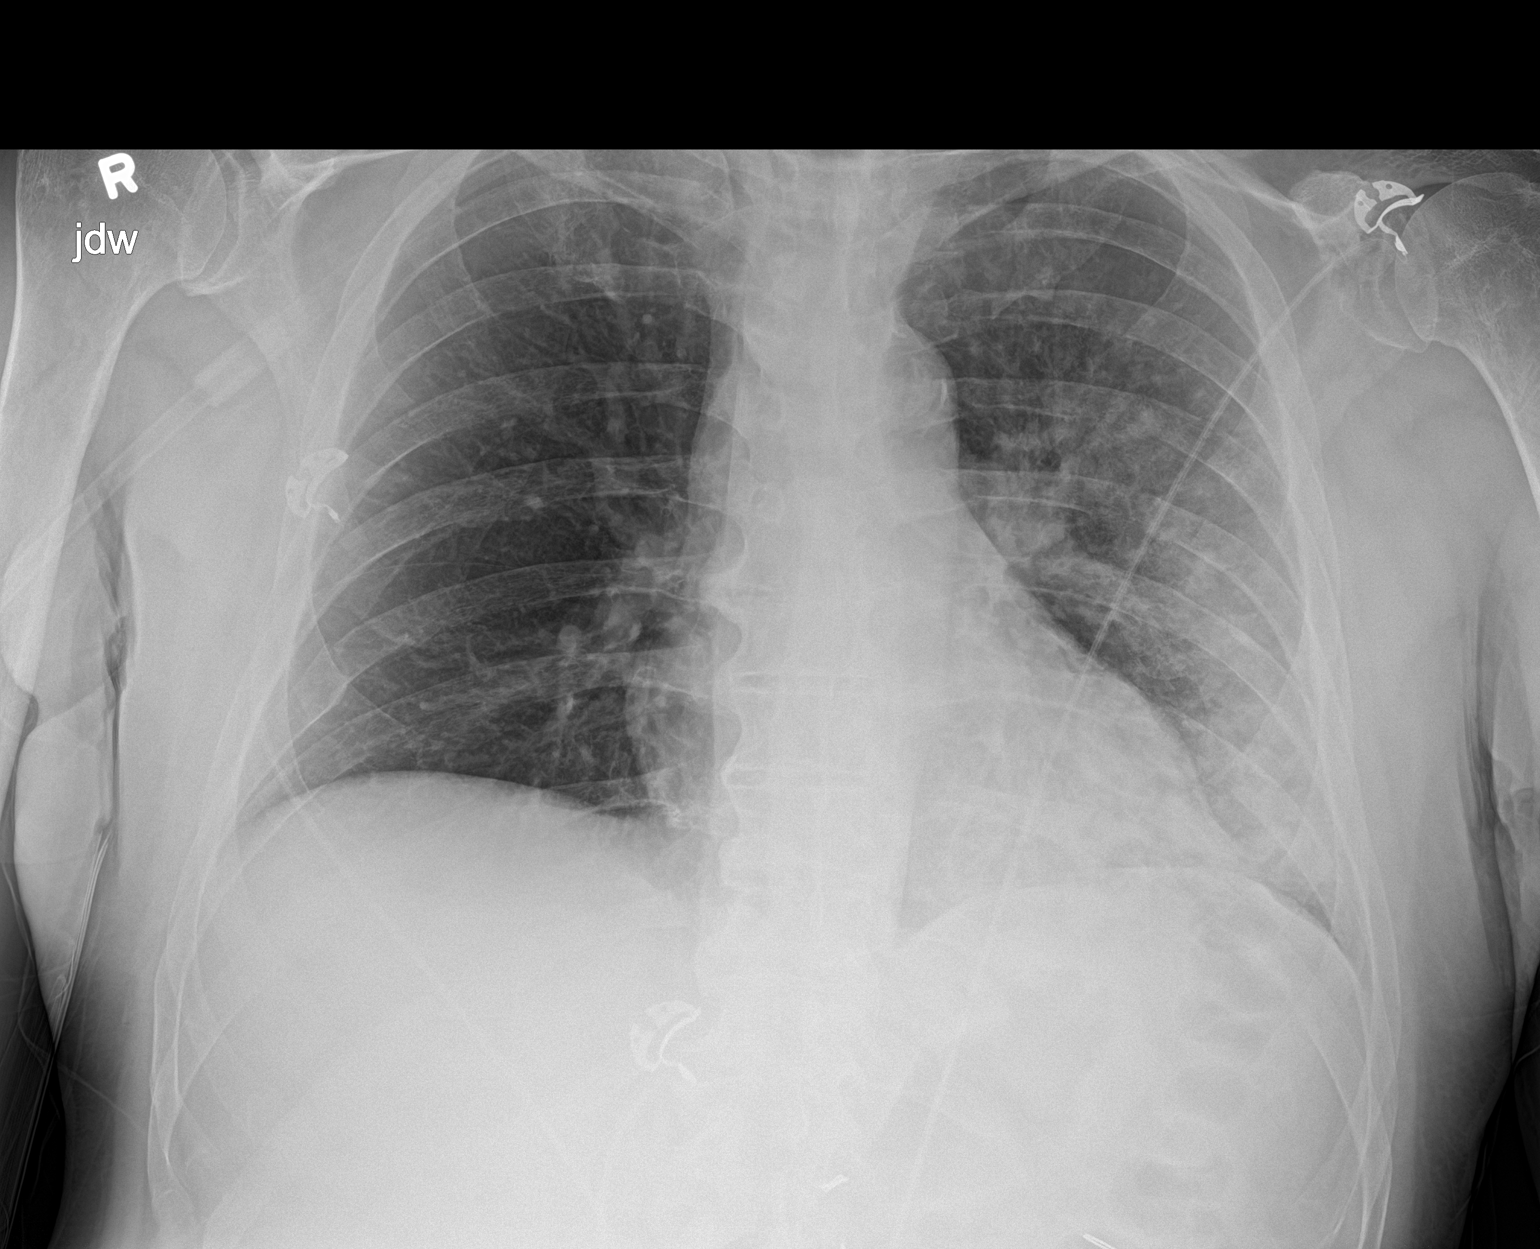

[1 of 1 positions shown; findings below may reference images not displayed]

FINDINGS: Normal heart size. Asymmetric opacity in the LEFT mid and lower lung
zones, concerning for infiltrate; atelectasis is less favored. No
effusion. Healed rib fractures on the RIGHT. Aortic atherosclerosis.
IMPRESSION: Asymmetric opacity in the LEFT mid and lower lung zones concerning
for infiltrate, query aspiration. Continued surveillance is
warranted.

## 2020-11-15 IMAGING — CR ABDOMEN - 2 VIEW
2 series · 2 of 2 positions shown · non-contrast
Comparison: No recent prior.

CLINICAL DATA: Nausea.  Abdominal pain.  Constipation.

EXAM:
ABDOMEN - 2 VIEW

[t abdomen supine]
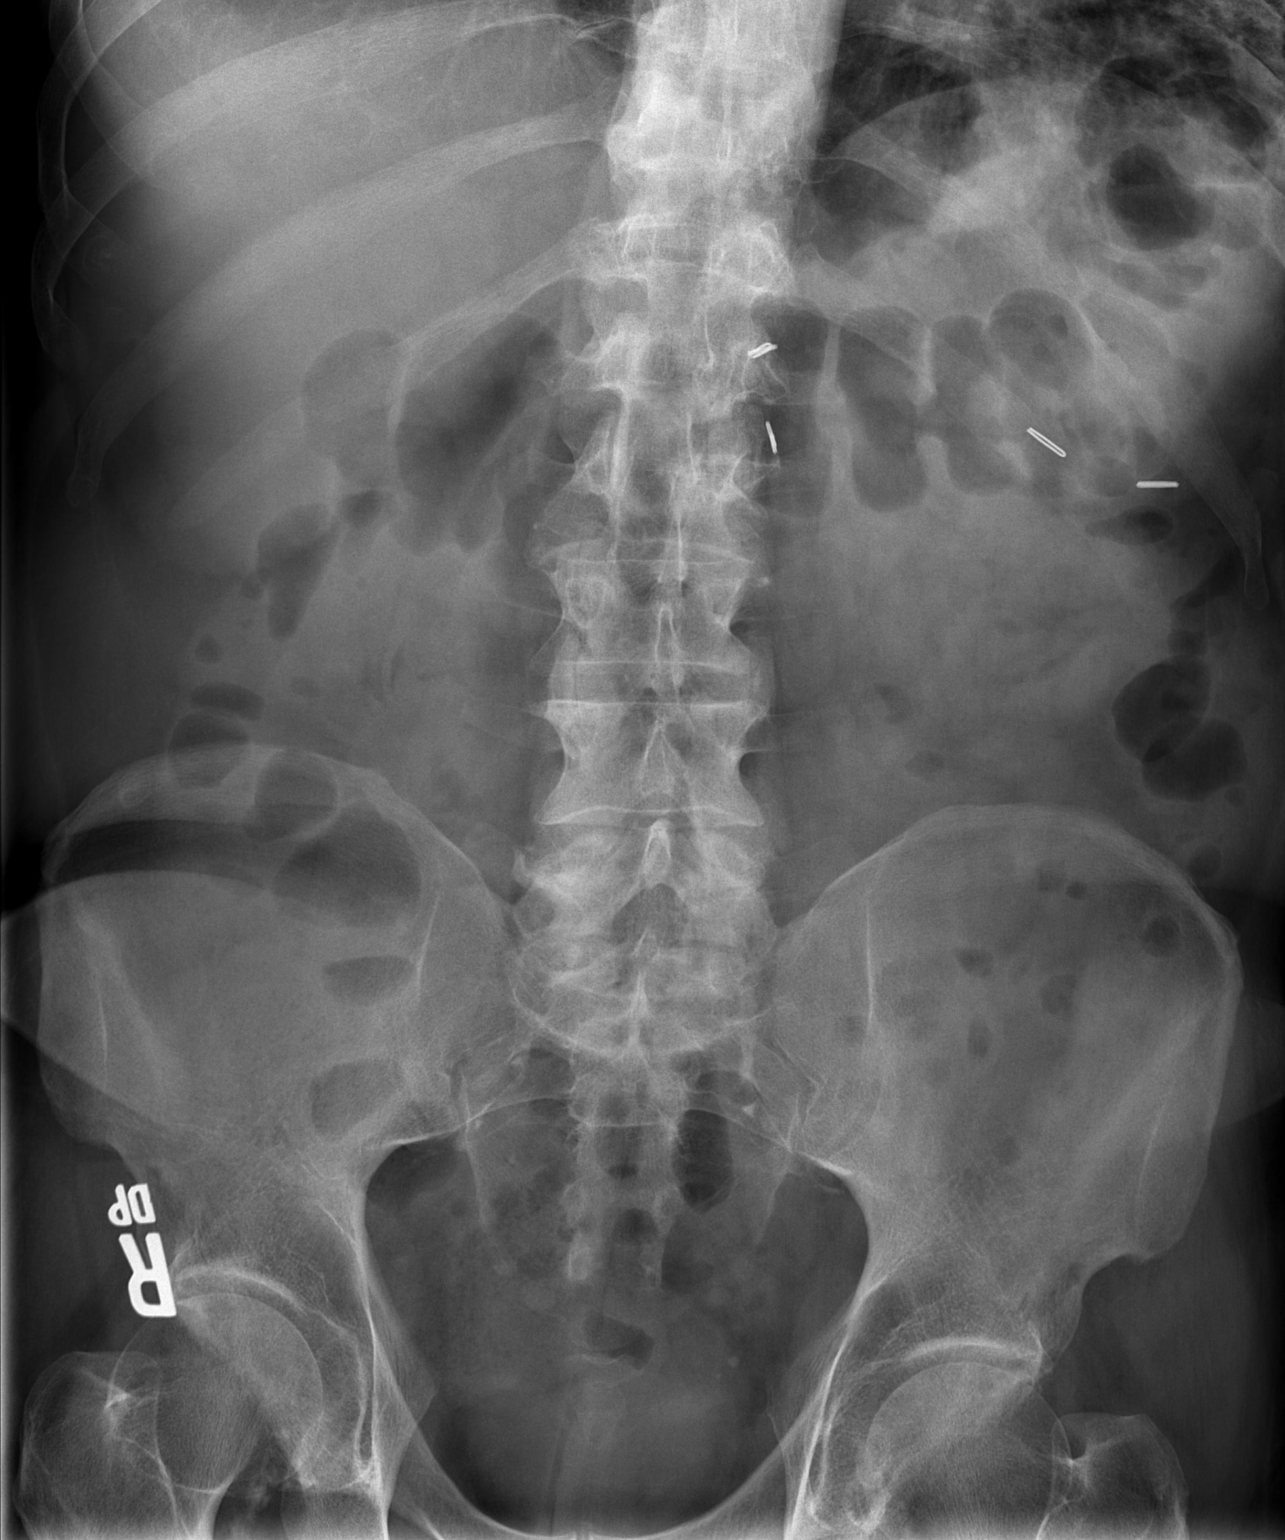

[w abdomen upright *]
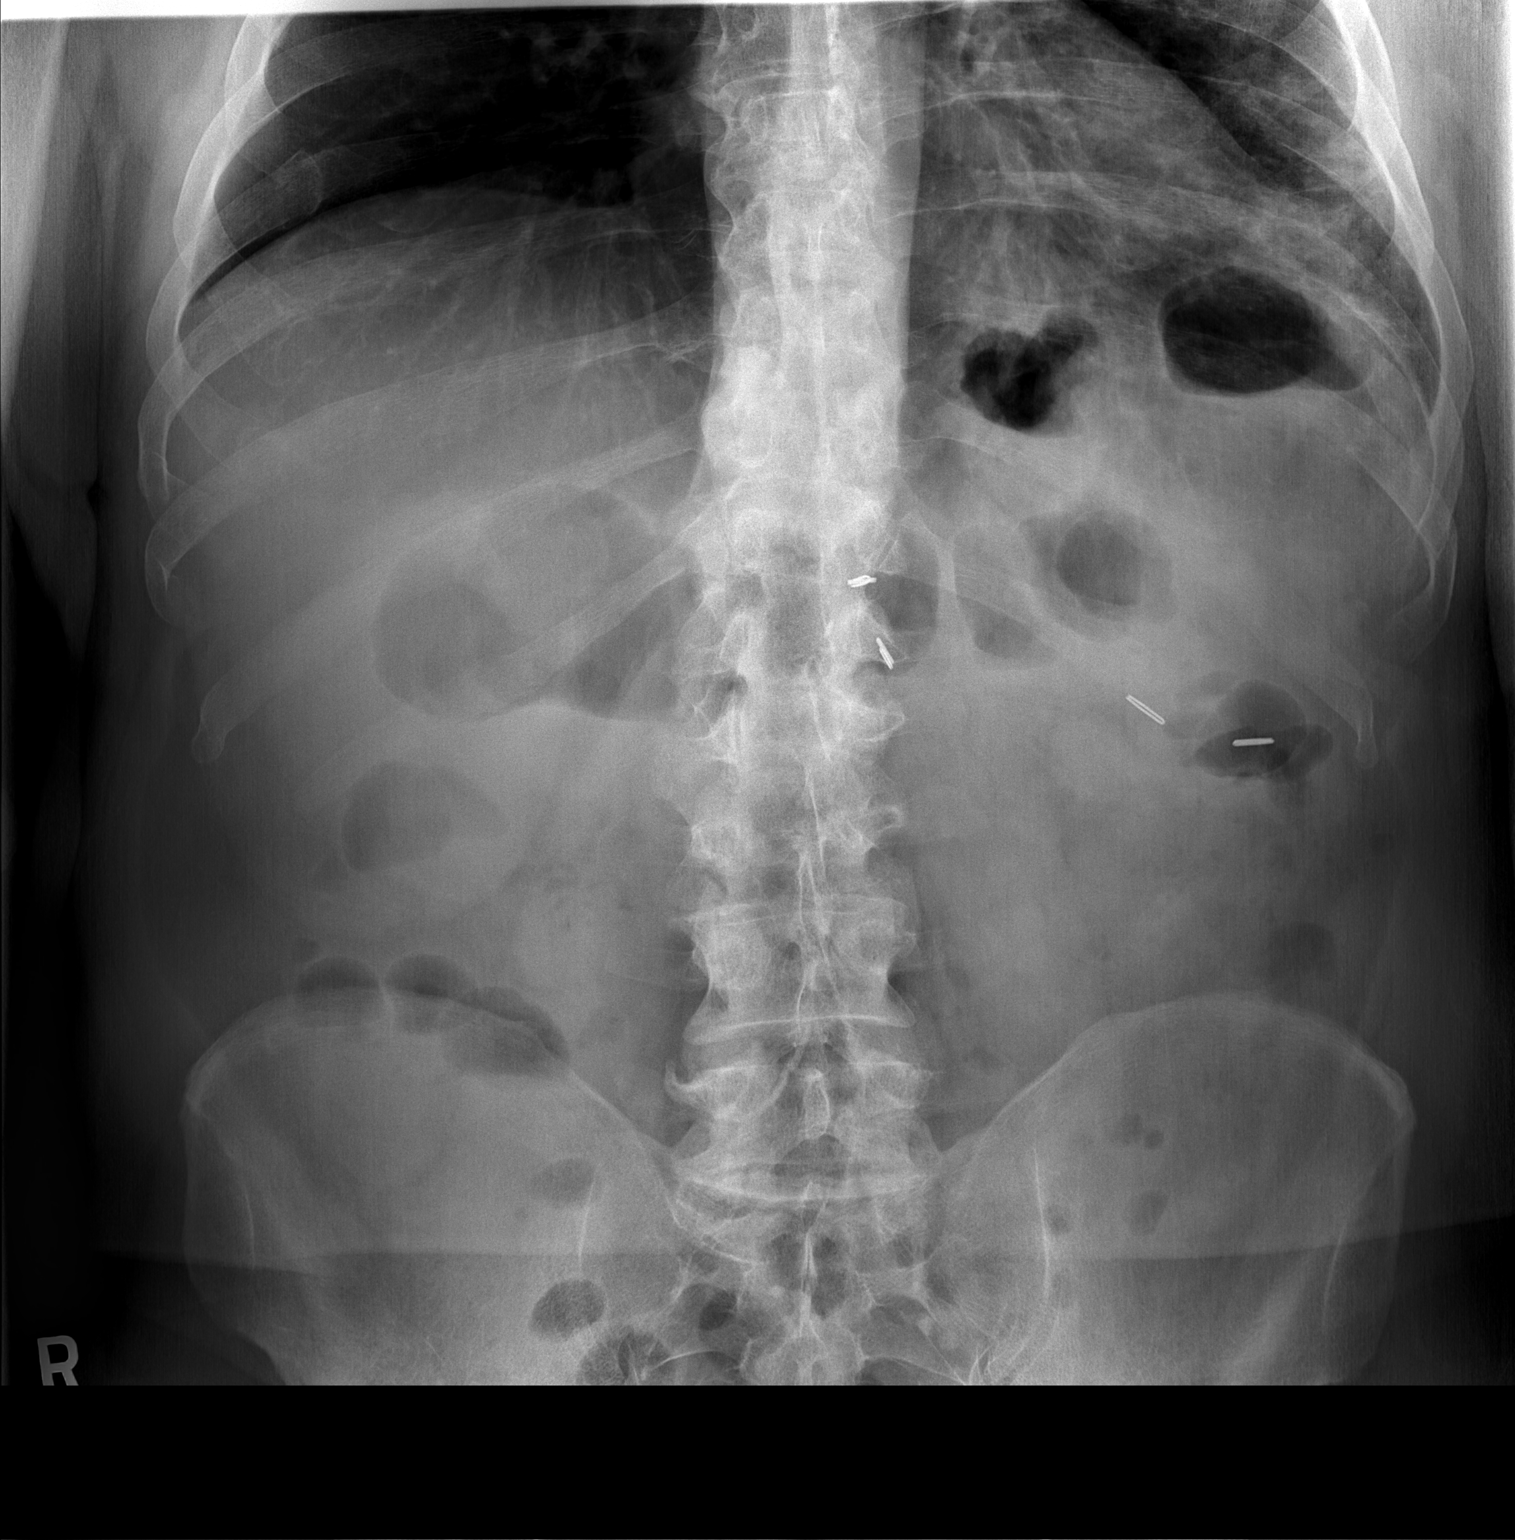

[2 of 2 positions shown; findings below may reference images not displayed]

FINDINGS: Left base atelectasis/infiltrate. Surgical clips left upper
quadrant. Soft tissues are unremarkable. No bowel distention. No
free air. No acute bony abnormality. Peripheral vascular
calcification. Pelvic calcifications consistent phleboliths.
IMPRESSION: 1.  Left base atelectasis/infiltrate.

2. Surgical clips left upper quadrant. No bowel distention. No free
air.

3.  Peripheral vascular disease.

## 2021-01-11 ENCOUNTER — Other Ambulatory Visit: Payer: Self-pay | Admitting: Cardiovascular Disease

## 2021-01-11 NOTE — Telephone Encounter (Signed)
Prescription refill request for Eliquis received. Last office visit:nahser 06/04/20 Scr:1.850 MG/ 09/07/2020 Age: 1mWeight:83.3kg

## 2021-03-15 DIAGNOSIS — N403 Nodular prostate with lower urinary tract symptoms: Secondary | ICD-10-CM | POA: Diagnosis not present

## 2021-03-17 DIAGNOSIS — R809 Proteinuria, unspecified: Secondary | ICD-10-CM | POA: Diagnosis not present

## 2021-03-17 DIAGNOSIS — N2581 Secondary hyperparathyroidism of renal origin: Secondary | ICD-10-CM | POA: Diagnosis not present

## 2021-03-17 DIAGNOSIS — N183 Chronic kidney disease, stage 3 unspecified: Secondary | ICD-10-CM | POA: Diagnosis not present

## 2021-03-17 DIAGNOSIS — Z905 Acquired absence of kidney: Secondary | ICD-10-CM | POA: Diagnosis not present

## 2021-03-17 DIAGNOSIS — I129 Hypertensive chronic kidney disease with stage 1 through stage 4 chronic kidney disease, or unspecified chronic kidney disease: Secondary | ICD-10-CM | POA: Diagnosis not present

## 2021-03-22 DIAGNOSIS — N403 Nodular prostate with lower urinary tract symptoms: Secondary | ICD-10-CM | POA: Diagnosis not present

## 2021-03-22 DIAGNOSIS — R350 Frequency of micturition: Secondary | ICD-10-CM | POA: Diagnosis not present

## 2021-03-22 DIAGNOSIS — Z905 Acquired absence of kidney: Secondary | ICD-10-CM | POA: Diagnosis not present

## 2021-04-28 DIAGNOSIS — Z1152 Encounter for screening for COVID-19: Secondary | ICD-10-CM | POA: Diagnosis not present

## 2021-04-28 DIAGNOSIS — R0981 Nasal congestion: Secondary | ICD-10-CM | POA: Diagnosis not present

## 2021-04-28 DIAGNOSIS — I130 Hypertensive heart and chronic kidney disease with heart failure and stage 1 through stage 4 chronic kidney disease, or unspecified chronic kidney disease: Secondary | ICD-10-CM | POA: Diagnosis not present

## 2021-04-28 DIAGNOSIS — R5383 Other fatigue: Secondary | ICD-10-CM | POA: Diagnosis not present

## 2021-04-28 DIAGNOSIS — I4891 Unspecified atrial fibrillation: Secondary | ICD-10-CM | POA: Diagnosis not present

## 2021-04-28 DIAGNOSIS — R051 Acute cough: Secondary | ICD-10-CM | POA: Diagnosis not present

## 2021-04-28 DIAGNOSIS — I509 Heart failure, unspecified: Secondary | ICD-10-CM | POA: Diagnosis not present

## 2021-04-28 DIAGNOSIS — J069 Acute upper respiratory infection, unspecified: Secondary | ICD-10-CM | POA: Diagnosis not present

## 2021-06-06 DIAGNOSIS — Z0100 Encounter for examination of eyes and vision without abnormal findings: Secondary | ICD-10-CM | POA: Diagnosis not present

## 2021-06-06 DIAGNOSIS — H5203 Hypermetropia, bilateral: Secondary | ICD-10-CM | POA: Diagnosis not present

## 2021-07-31 ENCOUNTER — Other Ambulatory Visit: Payer: Self-pay | Admitting: Cardiovascular Disease

## 2021-07-31 DIAGNOSIS — I480 Paroxysmal atrial fibrillation: Secondary | ICD-10-CM

## 2021-08-01 NOTE — Telephone Encounter (Signed)
Eliquis 5 mg refill request received. Patient is 76 years old, weight- 83.3 kg, Crea- 1.6 on 03/17/21 via [facility], Diagnosis- PAF, and last seen by Dr. Acie Fredrickson on 06/04/20. Dose is appropriate based on dosing criteria. Pt is overdue to see provider.  Will route to scheduling. ?

## 2021-08-22 DIAGNOSIS — E559 Vitamin D deficiency, unspecified: Secondary | ICD-10-CM | POA: Diagnosis not present

## 2021-08-22 DIAGNOSIS — E785 Hyperlipidemia, unspecified: Secondary | ICD-10-CM | POA: Diagnosis not present

## 2021-08-22 DIAGNOSIS — M109 Gout, unspecified: Secondary | ICD-10-CM | POA: Diagnosis not present

## 2021-08-22 DIAGNOSIS — I509 Heart failure, unspecified: Secondary | ICD-10-CM | POA: Diagnosis not present

## 2021-08-22 DIAGNOSIS — Z125 Encounter for screening for malignant neoplasm of prostate: Secondary | ICD-10-CM | POA: Diagnosis not present

## 2021-08-29 DIAGNOSIS — I130 Hypertensive heart and chronic kidney disease with heart failure and stage 1 through stage 4 chronic kidney disease, or unspecified chronic kidney disease: Secondary | ICD-10-CM | POA: Diagnosis not present

## 2021-08-29 DIAGNOSIS — N401 Enlarged prostate with lower urinary tract symptoms: Secondary | ICD-10-CM | POA: Diagnosis not present

## 2021-08-29 DIAGNOSIS — M109 Gout, unspecified: Secondary | ICD-10-CM | POA: Diagnosis not present

## 2021-08-29 DIAGNOSIS — Z1212 Encounter for screening for malignant neoplasm of rectum: Secondary | ICD-10-CM | POA: Diagnosis not present

## 2021-08-29 DIAGNOSIS — I2510 Atherosclerotic heart disease of native coronary artery without angina pectoris: Secondary | ICD-10-CM | POA: Diagnosis not present

## 2021-08-29 DIAGNOSIS — I4891 Unspecified atrial fibrillation: Secondary | ICD-10-CM | POA: Diagnosis not present

## 2021-08-29 DIAGNOSIS — R82998 Other abnormal findings in urine: Secondary | ICD-10-CM | POA: Diagnosis not present

## 2021-08-29 DIAGNOSIS — N1831 Chronic kidney disease, stage 3a: Secondary | ICD-10-CM | POA: Diagnosis not present

## 2021-08-29 DIAGNOSIS — Z23 Encounter for immunization: Secondary | ICD-10-CM | POA: Diagnosis not present

## 2021-08-29 DIAGNOSIS — R0600 Dyspnea, unspecified: Secondary | ICD-10-CM | POA: Diagnosis not present

## 2021-08-29 DIAGNOSIS — Z0000 Encounter for general adult medical examination without abnormal findings: Secondary | ICD-10-CM | POA: Diagnosis not present

## 2021-09-23 NOTE — Telephone Encounter (Signed)
Prescription refill request for Eliquis received. Indication: Afib  Last office visit:06/04/20 (Nahser)  Scr:  1.68 (03/17/21 via [facility])  Age: 76 Weight: 83.3kg  Office visit overdue. Pt has scheduled appt on 10/18/21 with Dr Acie Fredrickson. Appropriate dose and refill sent to requested pharmacy.

## 2021-09-23 NOTE — Telephone Encounter (Signed)
Patient is scheduled to see Dr. Acie Fredrickson on 10/18/21.

## 2021-10-18 ENCOUNTER — Encounter: Payer: Self-pay | Admitting: Cardiovascular Disease

## 2021-10-18 ENCOUNTER — Ambulatory Visit: Payer: Medicare HMO | Admitting: Cardiovascular Disease

## 2021-10-18 VITALS — BP 122/62 | HR 58 | Ht 68.0 in | Wt 185.0 lb

## 2021-10-18 DIAGNOSIS — I5022 Chronic systolic (congestive) heart failure: Secondary | ICD-10-CM

## 2021-10-18 DIAGNOSIS — I480 Paroxysmal atrial fibrillation: Secondary | ICD-10-CM

## 2021-10-18 DIAGNOSIS — E782 Mixed hyperlipidemia: Secondary | ICD-10-CM | POA: Diagnosis not present

## 2021-10-18 NOTE — Patient Instructions (Signed)
Medication Instructions:  Your physician recommends that you continue on your current medications as directed. Please refer to the Current Medication list given to you today.  *If you need a refill on your cardiac medications before your next appointment, please call your pharmacy*   Lab Work: NONE If you have labs (blood work) drawn today and your tests are completely normal, you will receive your results only by: MyChart Message (if you have MyChart) OR A paper copy in the mail If you have any lab test that is abnormal or we need to change your treatment, we will call you to review the results.   Testing/Procedures: NONE   Follow-Up: At [facility], you and your health needs are our priority.  As part of our continuing mission to provide you with exceptional heart care, we have created designated Provider Care Teams.  These Care Teams include your primary Cardiologist (physician) and Advanced Practice Providers (APPs -  Physician Assistants and Nurse Practitioners) who all work together to provide you with the care you need, when you need it.  Your next appointment:   1 year(s)  The format for your next appointment:   In Person  Provider:   Swinyer, Weaver, or Nahser {     Important Information About Sugar       

## 2021-10-18 NOTE — Progress Notes (Signed)
Cardiology Office Note   Date:  10/18/2021   ID:  Maurice Barron, DOB November 27, 1945, MRN [id number]  PCP:  [name], MD  Cardiologist:   Mertie Moores, MD   Chief Complaint  Patient presents with   Congestive Heart Failure        Coronary Artery Disease        1. Coronary artery disease- mild irregularities including a plaque rupture in the LAD, treated medically 2. Chronic systolic congestive heart failure with normalization of his LV function with meds.  3.  Hyperlipidemia 4.  Atrial fib  - March, 2020   Previous Notes:   Maurice Barron is a middle-aged gentleman with a history of coronary artery disease and congestive heart failure. Had a heart catheterization in 2005. During the heart catheterization he was found to have a ruptured plaque in his proximal left anterior descending artery. He had moderate irregularities and the other coronary arteries. He was started on Plavix at that time.  He's had a history of congestive heart failure. His initial ejection fraction was around 35%. With good medical therapy, his ejection fraction has now increased to 55%. He is no longer having any episodes of chest pain or shortness of breath.  He complains of having lots of bleeding and bruising on the Plavix. He would like to stop it. He's been exercising on an intermittent basis.  September 02, 2013:  Maurice Barron is doing well.   He was last seen 3 years ago.   He has been on Plavix for 10 years for ruptured plaque in its proximal LAD. [facility].     September 02, 2014:  Maurice Barron is a 76 y.o. male who presents for  Follow up of his CHF.  Still working out regularly.  Goes to the [facility].  Sep 07, 2015:  Maurice Barron is seen today for follow up of his chronic systolic CHF , mild CAD and hyperlipidemia Still goes to the [facility] 3 days a week.   His EF has now been normal for the past several years. ( although he has not had an echo in years.   Jan. 21,  2021 Maximum is seen back after 3 year absence Has a hx of CHF He has been seen by Kathyrn Drown, NP in July , 2020  He was admitted in March, 2020 with weakness and multiple falls and near syncope.  He was hypotensive and had AKI secondary to reduced p.o. intake.  He had an endoscopy and following endoscopy he was noted to be in atrial fibrillation with rapid ventricular response.  He was completely asymptomatic.  He was given a dose of the esmolol and started on IV Cardizem.  He converted to a  normal sinus rhythm shortly thereafter.  Repeat echocardiogram on March, 2020 revealed normal left ventricular systolic function.  6-8 months ago ,  Started getting pressure in his upper abd and lower chest .  Still has it. Has decreased in intensity and in frequency and duration . Almost no pain currently .   Worse with sitting for a prolonged time . Is not related to exercise.   Is back at the [facility] and these pains are not related to exertion .     CTs of the abdomen and lower chest have been unremarkable.  He has had upper and lower endoscopy without any specific diagnosis to explain his chest pain.  He did develop transient atrial fibrillation during his upper  endoscopy and was started on Eliquis at that time.  14-day outpatient telemetry monitor revealed normal sinus rhythm his rhythm and episodes of sinus tachycardia and sinus bradycardia.  He had a single nonsignificant 7 beat run of nonsustained VT.  There were no significant arrhythmias to explain any syncope or abdominal/chest pain.  Echo on July 22, 2018  Shows normal LV function with EF of 60-65%.   Feeling better.    Jan. 28, 2022: Maurice Barron is seen for folllow up of his CHF, CAD, HLD He has mild - mod CKD and has seen Dr. 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  Is not exercising much since he went back to work . No CP or dyspnea with his usual activities   October 18, 2021 Still working  with [employer]  No CP  [facility] but does not think his breathing is any better  Alicia stopped the med at that point  Exercises 3 days a week ( elliptical  for 2 miles,   then 7 stationary machines  Past Medical History:  Diagnosis Date   Anxiety    CHF (congestive heart failure) ([place])    EF 35%. WITH MEDICATION HIS EF IS NOW 55%   Coronary artery disease    ruptured plaque in the LAD - started Plavix at that point.   Diverticulosis    Enlarged prostate    Hyperlipidemia    PVC's (premature ventricular contractions)    Renal cyst     Past Surgical History:  Procedure Laterality Date   BIOPSY  07/22/2018   Procedure: BIOPSY;  Surgeon: Otis Brace, MD;  Location: [facility];  Service: Gastroenterology;;   CARDIAC CATHETERIZATION  12/04/2003   EF 30-35%   CARDIOVASCULAR STRESS TEST  08/20/2006   EF 50%   ESOPHAGOGASTRODUODENOSCOPY (EGD) WITH PROPOFOL Left 07/22/2018   Procedure: ESOPHAGOGASTRODUODENOSCOPY (EGD) WITH PROPOFOL;  Surgeon: Otis Brace, MD;  Location: [facility];  Service: Gastroenterology;  Laterality: Left;   HYDROCELE EXCISION / REPAIR     NEPHRECTOMY     TONSILLECTOMY     US ECHOCARDIOGRAPHY  08/07/2005   EF 50-55%     Current Outpatient Medications  Medication Sig Dispense Refill   amLODipine (NORVASC) 5 MG tablet Take 5 mg by mouth daily.     apixaban (ELIQUIS) 5 MG TABS tablet TAKE 1 TABLET BY MOUTH TWICE A DAY 60 tablet 1   buPROPion (WELLBUTRIN) 75 MG tablet Take 75 mg by mouth 2 (two) times daily.     buPROPion (WELLBUTRIN) 75 MG tablet Take 75 mg by mouth in the morning.     carvedilol (COREG) 25 MG tablet Take 25 mg by mouth 2 (two) times daily with a meal.     Cholecalciferol (VITAMIN D3) 125 MCG (5000 UT) TABS Take 1 tablet by mouth at bedtime.     finasteride (PROSCAR) 5 MG tablet Take 5 mg by mouth daily.     fish oil-omega-3 fatty acids 1000 MG capsule Take 1 g by mouth at bedtime.     pantoprazole (PROTONIX) 40 MG  tablet Take 40 mg by mouth 2 (two) times daily.     rosuvastatin (CRESTOR) 40 MG tablet Take 40 mg by mouth at bedtime.     buPROPion (WELLBUTRIN SR) 150 MG 12 hr tablet Take 150 mg by mouth every morning. (Patient not taking: Reported on 10/18/2021)     No current facility-administered medications for this visit.    Allergies:   Penicillins  Social History:  The patient  reports that he quit smoking about 29 years ago. His smoking use included cigarettes. He has never used smokeless tobacco. He reports current alcohol use. He reports that he does not use drugs.   Family History:  The patient's family history includes Coronary artery disease in his brother; Heart attack in his brother and father; Heart disease in his mother; Sarcoidosis in his mother.    ROS:  Please see the history of present illness.     All other systems are reviewed and negative.   Physical Exam: Blood pressure 122/62, pulse (!) 58, height '5\' 8"'$  (1.727 m), weight 185 lb (83.9 kg), SpO2 93 %.  GEN:  Well nourished, well developed in no acute distress HEENT: Normal NECK: No JVD; No carotid bruits LYMPHATICS: No lymphadenopathy CARDIAC: RRR , no murmurs, rubs, gallops RESPIRATORY:  Clear to auscultation without rales, wheezing or rhonchi  ABDOMEN: Soft, non-tender, non-distended MUSCULOSKELETAL:  No edema; No deformity  SKIN: Warm and dry NEUROLOGIC:  Alert and oriented x 3   EKG:   October 18, 2021: Sinus bradycardia at 58 beats minute.  Nonspecific T wave abnormalities.   Recent Labs: No results found for requested labs within last 365 days.    Lipid Panel    Component Value Date/Time   CHOL 132 11/19/2018 0906   TRIG 122 11/19/2018 0906   HDL 41 11/19/2018 0906   CHOLHDL 3.2 11/19/2018 0906   LDLCALC 67 11/19/2018 0906      Wt Readings from Last 3 Encounters:  10/18/21 185 lb (83.9 kg)  06/04/20 183 lb 9.6 oz (83.3 kg)  05/20/19 173 lb 6.4 oz (78.7 kg)      Other studies  Reviewed: Additional studies/ records that were reviewed today include: . Review of the above records demonstrates:    ASSESSMENT AND PLAN:  1.  Upper abdominal/lower chest pain.   .  2. Coronary artery disease-   no angina   3. Chronic systolic congestive heart failure-   his EF has normalized .  Cont current meds.    4.   Hyperlipidemia - managed by Dr. Dagmar Hait   5.  Paroxysmal Atrial fib:   [id number] =4 (age, CHF, vascular disease) Cont Eliquis     Current medicines are reviewed at length with the patient today.  The patient does not have concerns regarding medicines.  The following changes have been made:  no change  Labs/ tests ordered today include: No orders of the defined types were placed in this encounter.    Disposition:   FU with an APP or me in 1 year    Signed, Mertie Moores, MD  10/18/2021 4:09 PM    [facility] [place], [place], [place]  [address] Phone: [phone]; Fax: [phone]

## 2021-10-20 DIAGNOSIS — N183 Chronic kidney disease, stage 3 unspecified: Secondary | ICD-10-CM | POA: Diagnosis not present

## 2021-10-20 DIAGNOSIS — I129 Hypertensive chronic kidney disease with stage 1 through stage 4 chronic kidney disease, or unspecified chronic kidney disease: Secondary | ICD-10-CM | POA: Diagnosis not present

## 2021-10-20 DIAGNOSIS — N2581 Secondary hyperparathyroidism of renal origin: Secondary | ICD-10-CM | POA: Diagnosis not present

## 2021-10-20 DIAGNOSIS — Z905 Acquired absence of kidney: Secondary | ICD-10-CM | POA: Diagnosis not present

## 2021-10-20 DIAGNOSIS — R809 Proteinuria, unspecified: Secondary | ICD-10-CM | POA: Diagnosis not present

## 2022-02-01 DIAGNOSIS — I4891 Unspecified atrial fibrillation: Secondary | ICD-10-CM | POA: Diagnosis not present

## 2022-02-01 DIAGNOSIS — R0600 Dyspnea, unspecified: Secondary | ICD-10-CM | POA: Diagnosis not present

## 2022-02-01 DIAGNOSIS — N401 Enlarged prostate with lower urinary tract symptoms: Secondary | ICD-10-CM | POA: Diagnosis not present

## 2022-02-01 DIAGNOSIS — D126 Benign neoplasm of colon, unspecified: Secondary | ICD-10-CM | POA: Diagnosis not present

## 2022-02-01 DIAGNOSIS — E785 Hyperlipidemia, unspecified: Secondary | ICD-10-CM | POA: Diagnosis not present

## 2022-02-01 DIAGNOSIS — I2510 Atherosclerotic heart disease of native coronary artery without angina pectoris: Secondary | ICD-10-CM | POA: Diagnosis not present

## 2022-02-01 DIAGNOSIS — N1831 Chronic kidney disease, stage 3a: Secondary | ICD-10-CM | POA: Diagnosis not present

## 2022-02-01 DIAGNOSIS — I130 Hypertensive heart and chronic kidney disease with heart failure and stage 1 through stage 4 chronic kidney disease, or unspecified chronic kidney disease: Secondary | ICD-10-CM | POA: Diagnosis not present

## 2022-02-01 DIAGNOSIS — D692 Other nonthrombocytopenic purpura: Secondary | ICD-10-CM | POA: Diagnosis not present

## 2022-03-09 DIAGNOSIS — D126 Benign neoplasm of colon, unspecified: Secondary | ICD-10-CM | POA: Diagnosis not present

## 2022-03-09 DIAGNOSIS — I4891 Unspecified atrial fibrillation: Secondary | ICD-10-CM | POA: Diagnosis not present

## 2022-03-09 DIAGNOSIS — F439 Reaction to severe stress, unspecified: Secondary | ICD-10-CM | POA: Diagnosis not present

## 2022-03-09 DIAGNOSIS — I2510 Atherosclerotic heart disease of native coronary artery without angina pectoris: Secondary | ICD-10-CM | POA: Diagnosis not present

## 2022-03-09 DIAGNOSIS — I130 Hypertensive heart and chronic kidney disease with heart failure and stage 1 through stage 4 chronic kidney disease, or unspecified chronic kidney disease: Secondary | ICD-10-CM | POA: Diagnosis not present

## 2022-03-09 DIAGNOSIS — E785 Hyperlipidemia, unspecified: Secondary | ICD-10-CM | POA: Diagnosis not present

## 2022-03-09 DIAGNOSIS — R0600 Dyspnea, unspecified: Secondary | ICD-10-CM | POA: Diagnosis not present

## 2022-03-09 DIAGNOSIS — N1831 Chronic kidney disease, stage 3a: Secondary | ICD-10-CM | POA: Diagnosis not present

## 2022-03-09 DIAGNOSIS — N401 Enlarged prostate with lower urinary tract symptoms: Secondary | ICD-10-CM | POA: Diagnosis not present

## 2022-03-14 ENCOUNTER — Institutional Professional Consult (permissible substitution): Payer: Medicare HMO | Admitting: Pulmonary Disease

## 2022-03-15 ENCOUNTER — Ambulatory Visit: Payer: Medicare HMO | Admitting: Pulmonary Disease

## 2022-03-15 ENCOUNTER — Encounter: Payer: Self-pay | Admitting: Pulmonary Disease

## 2022-03-15 VITALS — BP 124/76 | HR 65 | Temp 97.9°F | Ht 68.0 in | Wt 186.8 lb

## 2022-03-15 DIAGNOSIS — R053 Chronic cough: Secondary | ICD-10-CM | POA: Diagnosis not present

## 2022-03-15 MED ORDER — ALBUTEROL SULFATE HFA 108 (90 BASE) MCG/ACT IN AERS: 2.0000 | INHALATION_SPRAY | Freq: Four times a day (QID) | RESPIRATORY_TRACT | 6 refills | Status: AC | PRN

## 2022-03-15 NOTE — Progress Notes (Signed)
Maurice Barron    [id number]    1945/05/25  Primary Care Physician:Avva, Steva Ready, MD  Referring Physician: [name], [place],  [place] [address]  Chief complaint:   Patient being seen for chronic cough  HPI:  Chronic cough Occasional shortness of breath  Difficulty with finishing sentences sometimes Has had episodes where he was seen again until Wednesday and could not get his breath  He is not limited with activities generally, he walks on the elliptical for about 2-1/2 miles every other day  Denies any chest pains or chest discomfort  Past history of reflux, last time he had significant problems with it was over a year ago  Past history of smoking 1-1 area of packs a day quit about 20 years ago  He did work in the garage, 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   apixaban (ELIQUIS) 5 MG TABS tablet TAKE 1 TABLET BY MOUTH TWICE A DAY   buPROPion (WELLBUTRIN SR) 150 MG 12 hr tablet Take 150 mg by mouth every morning.   buPROPion (WELLBUTRIN) 75 MG tablet Take 75 mg by mouth 2 (two) times daily.   buPROPion (WELLBUTRIN) 75 MG tablet Take 75 mg by mouth in the morning.   carvedilol (COREG) 25 MG tablet Take 25 mg by mouth 2 (two) times daily with a meal.   Cholecalciferol (VITAMIN D3) 125 MCG (5000 UT) TABS Take 1 tablet by mouth at bedtime.   finasteride (PROSCAR) 5 MG tablet Take 5 mg by mouth daily.   fish oil-omega-3 fatty acids 1000 MG  capsule Take 1 g by mouth at bedtime.   pantoprazole (PROTONIX) 40 MG tablet Take 40 mg by mouth 2 (two) times daily.   rosuvastatin (CRESTOR) 40 MG tablet Take 40 mg by mouth at bedtime.   No facility-administered encounter medications on file as of 03/15/2022.    Allergies as of 03/15/2022 - Review Complete 03/15/2022  Allergen Reaction Noted   Penicillins Other (See Comments) 12/01/2010    Past Medical History:  Diagnosis Date   Anxiety    CHF (congestive heart failure) ([facility])    EF 35%. WITH MEDICATION HIS EF IS NOW 55%   Coronary artery disease    ruptured plaque in the LAD - started Plavix at that point.   Diverticulosis    Enlarged prostate    Hyperlipidemia    PVC's (premature ventricular contractions)    Renal cyst     Past Surgical History:  Procedure Laterality Date   BIOPSY  07/22/2018   Procedure: BIOPSY;  Surgeon: Otis Brace, MD;  Location: [facility];  Service: Gastroenterology;;   CARDIAC CATHETERIZATION  12/04/2003   EF 30-35%   CARDIOVASCULAR STRESS TEST  08/20/2006   EF 50%   ESOPHAGOGASTRODUODENOSCOPY (EGD) WITH PROPOFOL Left 07/22/2018   Procedure: ESOPHAGOGASTRODUODENOSCOPY (EGD) WITH PROPOFOL;  Surgeon: Otis Brace, MD;  Location: [facility];  Service: Gastroenterology;  Laterality: Left;   HYDROCELE EXCISION / REPAIR     NEPHRECTOMY  TONSILLECTOMY     US ECHOCARDIOGRAPHY  08/07/2005   EF 50-55%    Family History  Problem Relation Age of Onset   Heart disease Mother    Sarcoidosis Mother    Heart attack Father    Heart attack Brother    Coronary artery disease Brother     Social History   Socioeconomic History   Marital status: Married    Spouse name: Not on file   Number of children: Not on file   Years of education: Not on file   Highest education level: Not on file  Occupational History   Not on file  Tobacco Use   Smoking status: Former    Types: Cigarettes    Quit date: 05/08/1992    Years since quitting: 29.8    Smokeless tobacco: Never  Vaping Use   Vaping Use: Never used  Substance and Sexual Activity   Alcohol use: Yes    Comment: occ   Drug use: No   Sexual activity: Not on file  Other Topics Concern   Not on file  Social History Narrative   Not on file   Social Determinants of Health   Financial Resource Strain: Not on file  Food Insecurity: Not on file  Transportation Needs: Not on file  Physical Activity: Not on file  Stress: Not on file  Social Connections: Not on file  Intimate Partner Violence: Not on file    Review of Systems  Respiratory:  Positive for cough and shortness of breath.     Vitals:   03/15/22 1445  BP: 124/76  Pulse: 65  Temp: 97.9 F (36.6 C)  SpO2: 96%     Physical Exam Constitutional:      Appearance: Normal appearance.  HENT:     Head: Normocephalic.  Cardiovascular:     Rate and Rhythm: Normal rate and regular rhythm.     Heart sounds: No murmur heard.    No friction rub.  Pulmonary:     Effort: No respiratory distress.     Breath sounds: No stridor. No wheezing, rhonchi or rales.  Musculoskeletal:     Cervical back: No rigidity or tenderness.  Neurological:     Mental Status: He is alert.  Psychiatric:        Mood and Affect: Mood normal.    Data Reviewed: Records from his primary doctor's office noted  I do not see a recent echocardiogram on him but noted to have history of systolic heart failure, there was recovery of his ejection fraction documented  Assessment:  Chronic cough  Intermittent shortness of breath  Patient exercises regularly and is able to tolerate over 2 miles of elliptical work 3 days a week  Does not have any reflux, does not have any sinus congestion or fullness  Past history of smoking, quit over 20 years ago  Plan/Recommendations: Obtain a pulmonary function test  Obtain CT scan of the chest without contrast  Prescribe rescue albuterol to be used as needed  Follow-up in 4 to 6  weeks  Encouraged to call with significant concerns   Sherrilyn Rist MD [facility] 03/15/2022, 3:08 PM  CC: [name], MD

## 2022-03-15 NOTE — Patient Instructions (Signed)
Schedule for PFT  Schedule for CT scan of the chest without contrast  Prescription for albuterol to be used as needed-chest tightness, wheezing, coughing following exposure to triggers  Follow-up in 4 to 6 weeks

## 2022-03-24 ENCOUNTER — Ambulatory Visit (HOSPITAL_COMMUNITY)
Admission: RE | Admit: 2022-03-24 | Discharge: 2022-03-24 | Disposition: A | Discharge status: Home / Self Care | Payer: Medicare HMO | Source: Ambulatory Visit | Attending: Pulmonary Disease | Admitting: Pulmonary Disease

## 2022-03-24 DIAGNOSIS — R053 Chronic cough: Secondary | ICD-10-CM | POA: Diagnosis not present

## 2022-03-24 DIAGNOSIS — I700 Atherosclerosis of aorta: Secondary | ICD-10-CM | POA: Diagnosis not present

## 2022-04-18 DIAGNOSIS — N183 Chronic kidney disease, stage 3 unspecified: Secondary | ICD-10-CM | POA: Diagnosis not present

## 2022-04-18 DIAGNOSIS — R809 Proteinuria, unspecified: Secondary | ICD-10-CM | POA: Diagnosis not present

## 2022-04-18 DIAGNOSIS — Z905 Acquired absence of kidney: Secondary | ICD-10-CM | POA: Diagnosis not present

## 2022-04-18 DIAGNOSIS — N2581 Secondary hyperparathyroidism of renal origin: Secondary | ICD-10-CM | POA: Diagnosis not present

## 2022-04-18 DIAGNOSIS — I129 Hypertensive chronic kidney disease with stage 1 through stage 4 chronic kidney disease, or unspecified chronic kidney disease: Secondary | ICD-10-CM | POA: Diagnosis not present

## 2022-05-03 ENCOUNTER — Ambulatory Visit (INDEPENDENT_AMBULATORY_CARE_PROVIDER_SITE_OTHER): Payer: Medicare HMO | Admitting: Pulmonary Disease

## 2022-05-03 ENCOUNTER — Encounter: Payer: Self-pay | Admitting: Pulmonary Disease

## 2022-05-03 ENCOUNTER — Ambulatory Visit: Payer: Medicare HMO | Admitting: Pulmonary Disease

## 2022-05-03 VITALS — BP 134/60 | HR 61 | Temp 97.7°F | Ht 68.0 in | Wt 189.6 lb

## 2022-05-03 DIAGNOSIS — R053 Chronic cough: Secondary | ICD-10-CM | POA: Diagnosis not present

## 2022-05-03 LAB — PULMONARY FUNCTION TEST
DL/VA % pred: 95 %
DL/VA: 3.76 ml/min/mmHg/L
DLCO cor % pred: 88 %
DLCO cor: 20.52 ml/min/mmHg
DLCO unc % pred: 88 %
DLCO unc: 20.52 ml/min/mmHg
FEF 25-75 Post: 3.26 L/sec
FEF 25-75 Pre: 2.74 L/sec
FEF2575-%Change-Post: 18 %
FEF2575-%Pred-Post: 172 %
FEF2575-%Pred-Pre: 145 %
FEV1-%Change-Post: 3 %
FEV1-%Pred-Post: 102 %
FEV1-%Pred-Pre: 98 %
FEV1-Post: 2.75 L
FEV1-Pre: 2.67 L
FEV1FVC-%Change-Post: 2 %
FEV1FVC-%Pred-Pre: 111 %
FEV6-%Change-Post: 0 %
FEV6-%Pred-Post: 94 %
FEV6-%Pred-Pre: 93 %
FEV6-Post: 3.32 L
FEV6-Pre: 3.3 L
FEV6FVC-%Change-Post: 0 %
FEV6FVC-%Pred-Post: 107 %
FEV6FVC-%Pred-Pre: 106 %
FVC-%Change-Post: 0 %
FVC-%Pred-Post: 88 %
FVC-%Pred-Pre: 87 %
FVC-Post: 3.34 L
FVC-Pre: 3.31 L
Post FEV1/FVC ratio: 83 %
Post FEV6/FVC ratio: 100 %
Pre FEV1/FVC ratio: 80 %
Pre FEV6/FVC Ratio: 100 %
RV % pred: 106 %
RV: 2.67 L
TLC % pred: 98 %
TLC: 6.51 L

## 2022-05-03 MED ORDER — IPRATROPIUM BROMIDE HFA 17 MCG/ACT IN AERS: 2.0000 | INHALATION_SPRAY | RESPIRATORY_TRACT | 12 refills | Status: AC | PRN

## 2022-05-03 NOTE — Patient Instructions (Signed)
Full PFT Performed Today  

## 2022-05-03 NOTE — Progress Notes (Signed)
Full PFT Performed Today  

## 2022-05-03 NOTE — Patient Instructions (Signed)
Trial with Atrovent  2 puff 3-4 times a day  Trying to use it for about 4 to 6 weeks and if not working, may be discontinued   Your breathing study is within normal limits Your CT scan is within normal limits  Follow-up in 3 months

## 2022-05-03 NOTE — Progress Notes (Signed)
Maurice Barron    [id number]    May 13, 1945  Primary Care Physician:Avva, Steva Ready, MD  Referring Physician: [name], [address],  [place] [address]  Chief complaint:   Patient being seen for chronic cough  HPI:  Chronic cough Occasional shortness of breath  Cough is exactly remained about the same Difficulty with finishing sentences sometimes  He is not limited with activities generally, he walks on the elliptical for about 2-1/2 miles every other day  No chest pain, no chest discomfort, past history of reflux but symptoms have completely resolved  Quit smoking over 20 years ago was smoking about 1 pack a day  He did work in the garage, 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   apixaban (ELIQUIS) 5 MG TABS tablet TAKE 1 TABLET BY MOUTH TWICE A DAY   carvedilol (COREG) 25 MG tablet Take 25 mg by mouth 2 (two) times daily with a meal.   Cholecalciferol (VITAMIN D3) 125 MCG (5000 UT) TABS Take 1 tablet by mouth at bedtime.   finasteride (PROSCAR) 5 MG tablet Take 5 mg by mouth daily.   fish oil-omega-3 fatty acids 1000 MG capsule Take 1 g by mouth at bedtime.   FLUAD QUADRIVALENT 0.5 ML injection    pantoprazole (PROTONIX) 40 MG tablet Take 40 mg by mouth 2 (two) times daily.   rosuvastatin (CRESTOR) 40 MG tablet Take 40 mg by mouth at bedtime.   albuterol (VENTOLIN HFA) 108 (90 Base) MCG/ACT inhaler Inhale 2 puffs into the lungs every 6 (six)  hours as needed for wheezing or shortness of breath. (Patient not taking: Reported on 05/03/2022)   buPROPion (WELLBUTRIN SR) 150 MG 12 hr tablet Take 150 mg by mouth every morning. (Patient not taking: Reported on 05/03/2022)   buPROPion (WELLBUTRIN) 75 MG tablet Take 75 mg by mouth 2 (two) times daily. (Patient not taking: Reported on 05/03/2022)   buPROPion (WELLBUTRIN) 75 MG tablet Take 75 mg by mouth in the morning. (Patient not taking: Reported on 05/03/2022)   No facility-administered encounter medications on file as of 05/03/2022.    Allergies as of 05/03/2022 - Review Complete 05/03/2022  Allergen Reaction Noted   Fluticasone-umeclidin-vilant  05/03/2022   Penicillins Other (See Comments) 12/01/2010    Past Medical History:  Diagnosis Date   Anxiety    CHF (congestive heart failure) ([facility])    EF 35%. WITH MEDICATION HIS EF IS NOW 55%   Coronary artery disease    ruptured plaque in the LAD - started Plavix at that point.   Diverticulosis    Enlarged prostate    Hyperlipidemia    PVC's (premature ventricular contractions)    Renal cyst     Past Surgical History:  Procedure Laterality Date   BIOPSY  07/22/2018   Procedure: BIOPSY;  Surgeon: Otis Brace, MD;  Location: [facility];  Service: Gastroenterology;;   CARDIAC CATHETERIZATION  12/04/2003   EF 30-35%   CARDIOVASCULAR STRESS TEST  08/20/2006  EF 50%   ESOPHAGOGASTRODUODENOSCOPY (EGD) WITH PROPOFOL Left 07/22/2018   Procedure: ESOPHAGOGASTRODUODENOSCOPY (EGD) WITH PROPOFOL;  Surgeon: Otis Brace, MD;  Location: [facility];  Service: Gastroenterology;  Laterality: Left;   HYDROCELE EXCISION / REPAIR     NEPHRECTOMY     TONSILLECTOMY     US ECHOCARDIOGRAPHY  08/07/2005   EF 50-55%    Family History  Problem Relation Age of Onset   Heart disease Mother    Sarcoidosis Mother    Heart attack Father    Heart attack Brother    Coronary artery disease Brother     Social History   Socioeconomic  History   Marital status: Married    Spouse name: Not on file   Number of children: Not on file   Years of education: Not on file   Highest education level: Not on file  Occupational History   Not on file  Tobacco Use   Smoking status: Former    Types: Cigarettes    Quit date: 05/08/1992    Years since quitting: 30.0   Smokeless tobacco: Never  Vaping Use   Vaping Use: Never used  Substance and Sexual Activity   Alcohol use: Yes    Comment: occ   Drug use: No   Sexual activity: Not on file  Other Topics Concern   Not on file  Social History Narrative   Not on file   Social Determinants of Health   Financial Resource Strain: Not on file  Food Insecurity: Not on file  Transportation Needs: Not on file  Physical Activity: Not on file  Stress: Not on file  Social Connections: Not on file  Intimate Partner Violence: Not on file    Review of Systems  Respiratory:  Positive for cough and shortness of breath.     Vitals:   05/03/22 1314  BP: 134/60  Pulse: 61  Temp: 97.7 F (36.5 C)  SpO2: 95%     Physical Exam Constitutional:      Appearance: Normal appearance.  HENT:     Head: Normocephalic.     Mouth/Throat:     Mouth: Mucous membranes are moist.  Eyes:     General: No scleral icterus. Cardiovascular:     Rate and Rhythm: Normal rate and regular rhythm.     Heart sounds: No murmur heard.    No friction rub.  Pulmonary:     Effort: No respiratory distress.     Breath sounds: No stridor. No wheezing, rhonchi or rales.  Musculoskeletal:     Cervical back: No rigidity or tenderness.  Neurological:     Mental Status: He is alert.  Psychiatric:        Mood and Affect: Mood normal.    Data Reviewed: Records from his primary doctor's office noted  I do not see a recent echocardiogram on him but noted to have history of systolic heart failure, there was recovery of his ejection fraction documented  PFT shows no obstruction, no significant bronchodilator  response, no restriction, normal diffusing capacity  Assessment:  Chronic cough  Intermittent shortness of breath  Not limited with activities  Past history of smoking, quit over 20 years ago  Plan/Recommendations: Trial with ipratropium to be used up to 3-4 times a day  Has a prescription for albuterol but has not really needed it  Encouraged to call with significant concerns     Sherrilyn Rist MD [facility] 05/03/2022, 1:22 PM  CC: [name], MD

## 2022-05-04 DIAGNOSIS — N403 Nodular prostate with lower urinary tract symptoms: Secondary | ICD-10-CM | POA: Diagnosis not present

## 2022-05-11 DIAGNOSIS — R350 Frequency of micturition: Secondary | ICD-10-CM | POA: Diagnosis not present

## 2022-05-11 DIAGNOSIS — N403 Nodular prostate with lower urinary tract symptoms: Secondary | ICD-10-CM | POA: Diagnosis not present

## 2022-07-08 ENCOUNTER — Emergency Department (HOSPITAL_COMMUNITY): Payer: Medicare HMO

## 2022-07-08 ENCOUNTER — Emergency Department (HOSPITAL_COMMUNITY)
Admission: EM | Admit: 2022-07-08 | Discharge: 2022-07-08 | Disposition: A | Discharge status: Home / Self Care | Payer: Medicare HMO | Attending: Emergency Medicine | Admitting: Emergency Medicine

## 2022-07-08 ENCOUNTER — Encounter (HOSPITAL_COMMUNITY): Payer: Self-pay | Admitting: *Deleted

## 2022-07-08 ENCOUNTER — Other Ambulatory Visit: Payer: Self-pay

## 2022-07-08 DIAGNOSIS — Z20822 Contact with and (suspected) exposure to covid-19: Secondary | ICD-10-CM | POA: Diagnosis not present

## 2022-07-08 DIAGNOSIS — R0981 Nasal congestion: Secondary | ICD-10-CM

## 2022-07-08 DIAGNOSIS — R059 Cough, unspecified: Secondary | ICD-10-CM | POA: Diagnosis not present

## 2022-07-08 DIAGNOSIS — J324 Chronic pansinusitis: Secondary | ICD-10-CM | POA: Diagnosis not present

## 2022-07-08 DIAGNOSIS — I509 Heart failure, unspecified: Secondary | ICD-10-CM | POA: Diagnosis not present

## 2022-07-08 DIAGNOSIS — H538 Other visual disturbances: Secondary | ICD-10-CM | POA: Insufficient documentation

## 2022-07-08 DIAGNOSIS — Z7901 Long term (current) use of anticoagulants: Secondary | ICD-10-CM | POA: Diagnosis not present

## 2022-07-08 DIAGNOSIS — I2510 Atherosclerotic heart disease of native coronary artery without angina pectoris: Secondary | ICD-10-CM | POA: Insufficient documentation

## 2022-07-08 LAB — CBC
HCT: 39.6 % (ref 39.0–52.0)
Hemoglobin: 12.8 g/dL — ABNORMAL LOW (ref 13.0–17.0)
MCH: 28.1 pg (ref 26.0–34.0)
MCHC: 32.3 g/dL (ref 30.0–36.0)
MCV: 87 fL (ref 80.0–100.0)
Platelets: 201 10*3/uL (ref 150–400)
RBC: 4.55 MIL/uL (ref 4.22–5.81)
RDW: 14.8 % (ref 11.5–15.5)
WBC: 8.5 10*3/uL (ref 4.0–10.5)
nRBC: 0 % (ref 0.0–0.2)

## 2022-07-08 LAB — BASIC METABOLIC PANEL
Anion gap: 9 (ref 5–15)
BUN: 32 mg/dL — ABNORMAL HIGH (ref 8–23)
CO2: 26 mmol/L (ref 22–32)
Calcium: 8.9 mg/dL (ref 8.9–10.3)
Chloride: 105 mmol/L (ref 98–111)
Creatinine, Ser: 2.02 mg/dL — ABNORMAL HIGH (ref 0.61–1.24)
GFR, Estimated: 33 mL/min — ABNORMAL LOW (ref >60)
Glucose, Bld: 100 mg/dL — ABNORMAL HIGH (ref 70–99)
Potassium: 4.6 mmol/L (ref 3.5–5.1)
Sodium: 140 mmol/L (ref 135–145)

## 2022-07-08 LAB — RESP PANEL BY RT-PCR (RSV, FLU A&B, COVID)  RVPGX2
Influenza A by PCR: NEGATIVE
Influenza B by PCR: NEGATIVE
Resp Syncytial Virus by PCR: NEGATIVE
SARS Coronavirus 2 by RT PCR: NEGATIVE

## 2022-07-08 NOTE — ED Notes (Signed)
Patient denies N/V/D and fever.

## 2022-07-08 NOTE — ED Triage Notes (Signed)
C/o cough nasal congestion , feels like he is unsteady walking and his vision is "foggy" onset Thurs.

## 2022-07-08 NOTE — ED Provider Notes (Signed)
[place] [facility]GENC[facility] DEPARTMENT AT [facility] Provider Note   CSN: [id number] Arrival date & time: 07/08/22  1242     History  Chief Complaint  Patient presents with   Nasal Congestion    Maurice Barron is a 77 y.o. male with a past medical history of CHF, CAD, diverticulosis, hyperlipidemia presenting today for evaluation of nasal congestion.  Patient states he has had nasal congestion in the last 3 days with associated productive cough.  He denies any fever, chest pain, shortness of breath, nausea, vomiting, bowel change, urinary symptoms, blood in stool or urine.  He tested negative for COVID yesterday.  History of A-fib on Eliquis.  No known sick contact.  Patient reports today he started to have blurry vision.  Denies any visual field cuts.  States he been feeling unstable on his feet today.  HPI  Past Medical History:  Diagnosis Date   Anxiety    CHF (congestive heart failure) (HCC)    EF 35%. WITH MEDICATION HIS EF IS NOW 55%   Coronary artery disease    ruptured plaque in the LAD - started Plavix at that point.   Diverticulosis    Enlarged prostate    Hyperlipidemia    PVC's (premature ventricular contractions)    Renal cyst    Past Surgical History:  Procedure Laterality Date   BIOPSY  07/22/2018   Procedure: BIOPSY;  Surgeon: Kathi Der, MD;  Location: [facility];  Service: Gastroenterology;;   CARDIAC CATHETERIZATION  12/04/2003   EF 30-35%   CARDIOVASCULAR STRESS TEST  08/20/2006   EF 50%   ESOPHAGOGASTRODUODENOSCOPY (EGD) WITH PROPOFOL Left 07/22/2018   Procedure: ESOPHAGOGASTRODUODENOSCOPY (EGD) WITH PROPOFOL;  Surgeon: Kathi Der, MD;  Location: [facility];  Service: Gastroenterology;  Laterality: Left;   HYDROCELE EXCISION / REPAIR     NEPHRECTOMY     TONSILLECTOMY     US ECHOCARDIOGRAPHY  08/07/2005   EF 50-55%     Home Medications Prior to Admission medications   Medication Sig Start Date End Date Taking? Authorizing  Provider  albuterol (VENTOLIN HFA) 108 (90 Base) MCG/ACT inhaler Inhale 2 puffs into the lungs every 6 (six) hours as needed for wheezing or shortness of breath. Patient not taking: Reported on 05/03/2022 03/15/22   Virl Diamond A, MD  amLODipine (NORVASC) 5 MG tablet Take 5 mg by mouth daily. 08/20/21   [provider]  apixaban (ELIQUIS) 5 MG TABS tablet TAKE 1 TABLET BY MOUTH TWICE A DAY 09/23/21   Nahser, Deloris Ping, MD  buPROPion [facility] SR) 150 MG 12 hr tablet Take 150 mg by mouth every morning. Patient not taking: Reported on 05/03/2022 09/13/21   [provider]  buPROPion (WELLBUTRIN) 75 MG tablet Take 75 mg by mouth 2 (two) times daily. Patient not taking: Reported on 05/03/2022    [provider]  buPROPion (WELLBUTRIN) 75 MG tablet Take 75 mg by mouth in the morning. Patient not taking: Reported on 05/03/2022    [provider]  carvedilol (COREG) 25 MG tablet Take 25 mg by mouth 2 (two) times daily with a meal.    [provider]  Cholecalciferol (VITAMIN D3) 125 MCG (5000 UT) TABS Take 1 tablet by mouth at bedtime.    [provider]  finasteride (PROSCAR) 5 MG tablet Take 5 mg by mouth daily. 03/25/19   [provider]  fish oil-omega-3 fatty acids 1000 MG capsule Take 1 g by mouth at bedtime.    [provider]  FLUAD QUADRIVALENT 0.5 ML injection  01/19/22   [provider]  ipratropium (ATROVENT HFA) 17 MCG/ACT inhaler Inhale 2 puffs into the lungs every 4 (four) hours as needed for wheezing. Use for coughing 05/03/22   Olalere, Adewale A, MD  pantoprazole (PROTONIX) 40 MG tablet Take 40 mg by mouth 2 (two) times daily. 07/16/18   [provider]  rosuvastatin (CRESTOR) 40 MG tablet Take 40 mg by mouth at bedtime.    [provider]      Allergies    Fluticasone-umeclidin-vilant and Penicillins    Review of Systems   Review of Systems Negative except as per HPI.  Physical  Exam Updated Vital Signs BP 139/73 (BP Location: Right Arm)   Pulse 62   Temp 98 F (36.7 C) (Oral)   Resp 18   Ht 5\' 9"  (1.753 m)   Wt 84.4 kg   SpO2 96%   BMI 27.47 kg/m  Physical Exam Vitals and nursing note reviewed.  Constitutional:      Appearance: Normal appearance.  HENT:     Head: Normocephalic and atraumatic.     Mouth/Throat:     Mouth: Mucous membranes are moist.  Eyes:     General: No scleral icterus. Cardiovascular:     Rate and Rhythm: Normal rate and regular rhythm.     Pulses: Normal pulses.     Heart sounds: Normal heart sounds.  Pulmonary:     Effort: Pulmonary effort is normal.     Breath sounds: Normal breath sounds.  Abdominal:     General: Abdomen is flat.     Palpations: Abdomen is soft.     Tenderness: There is no abdominal tenderness.  Musculoskeletal:        General: No deformity.  Skin:    General: Skin is warm.     Findings: No rash.  Neurological:     General: No focal deficit present.     Mental Status: He is alert.     Comments: Cranial nerves II through XII intact. Intact sensation to light touch in all 4 extremities. 5/5 strength in all 4 extremities. Intact finger-to-nose and heel-to-shin of all 4 extremities. No visual field cuts. No neglect noted. No aphasia noted.   Psychiatric:        Mood and Affect: Mood normal.     ED Results / Procedures / Treatments   Labs (all labs ordered are listed, but only abnormal results are displayed) Labs Reviewed  CBC - Abnormal; Notable for the following components:      Result Value   Hemoglobin 12.8 (*)    All other components within normal limits  BASIC METABOLIC PANEL - Abnormal; Notable for the following components:   Glucose, Bld 100 (*)    BUN 32 (*)    Creatinine, Ser 2.02 (*)    GFR, Estimated 33 (*)    All other components within normal limits  RESP PANEL BY RT-PCR (RSV, FLU A&B, COVID)  RVPGX2    EKG None  Radiology CT Head Wo Contrast  Result Date:  07/08/2022 CLINICAL DATA:  blurred vision, unsteady gait EXAM: CT HEAD WITHOUT CONTRAST TECHNIQUE: Contiguous axial images were obtained from the base of the skull through the vertex without intravenous contrast. RADIATION DOSE REDUCTION: This exam was performed according to the departmental dose-optimization program which includes automated exposure control, adjustment of the mA and/or kV according to patient size and/or use of iterative reconstruction technique. COMPARISON:  None Available. FINDINGS: Brain: No evidence of acute  infarction, hemorrhage, hydrocephalus, extra-axial collection or mass lesion/mass effect. Vascular: No hyperdense vessel or unexpected calcification. Skull: Normal. Negative for fracture or focal lesion. Sinuses/Orbits: Mucoperiosteal thickening identified consistent with chronic maxillary, ethmoid, frontal and sphenoid sinusitis. IMPRESSION: Chronic pansinusitis.  No acute intracranial process. Electronically Signed   By: Layla Maw M.D.   On: 07/08/2022 16:47   DG Chest 2 View  Result Date: 07/08/2022 CLINICAL DATA:  Cough and congestion EXAM: CHEST - 2 VIEW COMPARISON:  July 22, 2018 chest x-ray FINDINGS: The heart size and mediastinal contours are within normal limits. Both lungs are clear. The visualized skeletal structures are unremarkable. IMPRESSION: No active cardiopulmonary disease. Electronically Signed   By: Gerome Sam III M.D.   On: 07/08/2022 13:13    Procedures Procedures    Medications Ordered in ED Medications - No data to display  ED Course/ Medical Decision Making/ A&P                             Medical Decision Making Amount and/or Complexity of Data Reviewed Labs: ordered. Radiology: ordered.   This patient presents to the ED for nasal congestion, this involves an extensive number of treatment options, and is a complaint that carries with a high risk of complications and morbidity.  The differential diagnosis includes flu, COVID, RSV,  pneumonia, ICH, infectious etiology.  This is not an exhaustive list.  Lab tests: I ordered and personally interpreted labs.  The pertinent results include: WBC unremarkable. Hbg unremarkable. Platelets unremarkable. Electrolytes unremarkable. BUN 32, creatinine 2.02.  Viral negative for COVID, flu, RSV.  Imaging studies: I ordered imaging studies. I personally reviewed, interpreted imaging and agree with the radiologist's interpretations. The results include: CT head negative for bleeding.  Problem list/ ED course/ Critical interventions/ Medical management: HPI: See above Vital signs within normal range and stable throughout visit. Laboratory/imaging studies significant for: See above. On physical examination, patient is afebrile and appears in no acute distress. This patient presents with symptoms suspicious for likely viral upper respiratory infection. Based on history and physical doubt sinusitis. COVID test was negative. Do not suspect underlying cardiopulmonary process. I considered, but think unlikely, dangerous causes of this patient's symptoms to include ACS, CHF or COPD exacerbations, pneumonia, pneumothorax. Patient is nontoxic appearing and not in need of emergent medical intervention. CT head pending. I have reviewed the patient home medicines and have made adjustments as needed.  Cardiac monitoring/EKG: The patient was maintained on a cardiac monitor.  I personally reviewed and interpreted the cardiac monitor which showed an underlying rhythm of: sinus rhythm.  Additional history obtained: External records from outside source obtained and reviewed including: Chart review including previous notes, labs, imaging.  Disposition Patient care signed out at shift change to [facility], PA-C with pending CT head.  This chart was dictated using voice recognition software.  Despite best efforts to proofread,  errors can occur which can change the documentation meaning.           Final Clinical Impression(s) / ED Diagnoses Final diagnoses:  Nasal congestion  Blurred vision  Chronic pansinusitis    Rx / DC Orders ED Discharge Orders     None         Jeanelle Malling, [place] 07/09/22 1522    Gerhard Munch, MD 07/09/22 [phone]

## 2022-07-08 NOTE — ED Provider Notes (Signed)
Patient was handed off to me from Rex Kras, PA-C. Plan was wait for pending CT head before dispositioning patient. Physical Exam  BP 139/73 (BP Location: Right Arm)   Pulse 62   Temp 98 F (36.7 C) (Oral)   Resp 18   Ht '5\' 9"'$  (1.753 m)   Wt 84.4 kg   SpO2 96%   BMI 27.47 kg/m   Physical Exam  Procedures  Procedures  ED Course / MDM    Medical Decision Making Amount and/or Complexity of Data Reviewed Labs: ordered. Radiology: ordered.   Patient was handed off from Rex Kras, [place]. Please see his note for full HPI. CT head with evidence of chronic pansinusitis. Informed patient of results and advised using daily nasal steroid sprays such as Flonase and Nasocort. Patient agreeable with treatment plan and verbalized understanding return precautions. All questions answered prior to patient discharge.       Luvenia Heller, PA-C 07/08/22 [place], [place], DO 07/08/22 2304

## 2022-07-08 NOTE — Discharge Instructions (Addendum)
You were seen in the emergency department for nasal congestion.  Thankfully your CT scan was reassuring as the only evident abnormality seen was chronic pansinusitis.  I would recommend managing your symptoms with a daily nasal steroid spray such as Flonase or Nasacort.  If your symptoms significantly worsen, you can plan to follow-up either with your primary care provider or return back to the emergency department for further evaluation.

## 2022-07-08 NOTE — ED Notes (Signed)
Patient transported to CT 

## 2022-07-11 ENCOUNTER — Telehealth: Payer: Self-pay

## 2022-07-11 NOTE — Telephone Encounter (Signed)
     Patient  visit on 07/08/2022  at [facility]. [facility] was for nasal congestion.  Have you been able to follow up with your primary care physician? Patient stated he is feeling better.  The patient was or was not able to obtain any needed medicine or equipment. Patient was able to obtain OTC drugs.  Are there diet recommendations that you are having difficulty following? No  Patient expresses understanding of discharge instructions and education provided has no other needs at this time. Yes   [place] Resource Care Guide   [email or web address]  ?? [id number]   Website: [email or web address]  [email or web address]

## 2022-09-04 DIAGNOSIS — E785 Hyperlipidemia, unspecified: Secondary | ICD-10-CM | POA: Diagnosis not present

## 2022-09-04 DIAGNOSIS — R7989 Other specified abnormal findings of blood chemistry: Secondary | ICD-10-CM | POA: Diagnosis not present

## 2022-09-04 DIAGNOSIS — I509 Heart failure, unspecified: Secondary | ICD-10-CM | POA: Diagnosis not present

## 2022-09-04 DIAGNOSIS — M109 Gout, unspecified: Secondary | ICD-10-CM | POA: Diagnosis not present

## 2022-09-11 DIAGNOSIS — Z0000 Encounter for general adult medical examination without abnormal findings: Secondary | ICD-10-CM | POA: Diagnosis not present

## 2022-09-11 DIAGNOSIS — Z1339 Encounter for screening examination for other mental health and behavioral disorders: Secondary | ICD-10-CM | POA: Diagnosis not present

## 2022-09-11 DIAGNOSIS — N1831 Chronic kidney disease, stage 3a: Secondary | ICD-10-CM | POA: Diagnosis not present

## 2022-09-11 DIAGNOSIS — D692 Other nonthrombocytopenic purpura: Secondary | ICD-10-CM | POA: Diagnosis not present

## 2022-09-11 DIAGNOSIS — I509 Heart failure, unspecified: Secondary | ICD-10-CM | POA: Diagnosis not present

## 2022-09-11 DIAGNOSIS — I4891 Unspecified atrial fibrillation: Secondary | ICD-10-CM | POA: Diagnosis not present

## 2022-09-11 DIAGNOSIS — E785 Hyperlipidemia, unspecified: Secondary | ICD-10-CM | POA: Diagnosis not present

## 2022-09-11 DIAGNOSIS — I130 Hypertensive heart and chronic kidney disease with heart failure and stage 1 through stage 4 chronic kidney disease, or unspecified chronic kidney disease: Secondary | ICD-10-CM | POA: Diagnosis not present

## 2022-09-11 DIAGNOSIS — Z1331 Encounter for screening for depression: Secondary | ICD-10-CM | POA: Diagnosis not present

## 2022-09-11 DIAGNOSIS — R82998 Other abnormal findings in urine: Secondary | ICD-10-CM | POA: Diagnosis not present

## 2022-09-11 DIAGNOSIS — I2510 Atherosclerotic heart disease of native coronary artery without angina pectoris: Secondary | ICD-10-CM | POA: Diagnosis not present

## 2022-10-20 DIAGNOSIS — Z905 Acquired absence of kidney: Secondary | ICD-10-CM | POA: Diagnosis not present

## 2022-10-20 DIAGNOSIS — R809 Proteinuria, unspecified: Secondary | ICD-10-CM | POA: Diagnosis not present

## 2022-10-20 DIAGNOSIS — I129 Hypertensive chronic kidney disease with stage 1 through stage 4 chronic kidney disease, or unspecified chronic kidney disease: Secondary | ICD-10-CM | POA: Diagnosis not present

## 2022-10-20 DIAGNOSIS — N2581 Secondary hyperparathyroidism of renal origin: Secondary | ICD-10-CM | POA: Diagnosis not present

## 2022-10-20 DIAGNOSIS — N183 Chronic kidney disease, stage 3 unspecified: Secondary | ICD-10-CM | POA: Diagnosis not present

## 2022-11-13 DIAGNOSIS — I130 Hypertensive heart and chronic kidney disease with heart failure and stage 1 through stage 4 chronic kidney disease, or unspecified chronic kidney disease: Secondary | ICD-10-CM | POA: Diagnosis not present

## 2022-11-13 DIAGNOSIS — E785 Hyperlipidemia, unspecified: Secondary | ICD-10-CM | POA: Diagnosis not present

## 2022-11-22 DIAGNOSIS — H524 Presbyopia: Secondary | ICD-10-CM | POA: Diagnosis not present

## 2022-11-22 DIAGNOSIS — H52221 Regular astigmatism, right eye: Secondary | ICD-10-CM | POA: Diagnosis not present

## 2022-11-22 DIAGNOSIS — H5203 Hypermetropia, bilateral: Secondary | ICD-10-CM | POA: Diagnosis not present

## 2022-11-22 DIAGNOSIS — Z0100 Encounter for examination of eyes and vision without abnormal findings: Secondary | ICD-10-CM | POA: Diagnosis not present

## 2022-11-22 DIAGNOSIS — H2513 Age-related nuclear cataract, bilateral: Secondary | ICD-10-CM | POA: Diagnosis not present

## 2023-01-03 ENCOUNTER — Encounter: Payer: Self-pay | Admitting: Pulmonary Disease

## 2023-01-03 ENCOUNTER — Ambulatory Visit: Payer: Medicare HMO | Admitting: Pulmonary Disease

## 2023-01-03 VITALS — BP 130/60 | HR 57 | Temp 97.2°F | Ht 68.0 in | Wt 184.4 lb

## 2023-01-03 DIAGNOSIS — R053 Chronic cough: Secondary | ICD-10-CM

## 2023-01-03 NOTE — Progress Notes (Signed)
Maurice Barron    [id number]    09-Apr-1946  Primary Care Physician:Avva, Joylene Draft, MD  Referring Physician: Chilton Greathouse, MD [address] [place],  [place] [address]  Chief complaint:   Patient being seen for chronic cough  HPI:  Cough is remained stable No significant shortness of breath  Remains very active  He does have a prescription for albuterol which he rarely ever uses I had prescribed ipratropium and has not really needed it  Overall, does not feel limited  No changes in the cough, he is not really bothered by it at present  He is not limited with activities generally, he walks on the elliptical for about 2-1/2 miles every other day  No chest pain, no chest discomfort, past history of reflux but symptoms have completely resolved  Quit smoking over 20 years ago was smoking about 1 pack a day  He did work in the garage, 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   apixaban (ELIQUIS) 5 MG TABS tablet TAKE 1 TABLET BY MOUTH TWICE A DAY   carvedilol (COREG) 25 MG tablet Take 25 mg by mouth 2 (two) times daily with a meal.   Cholecalciferol (VITAMIN D3) 125 MCG (5000 UT) TABS Take 1 tablet by mouth at bedtime.   finasteride (PROSCAR) 5 MG tablet Take 5 mg by mouth daily.   fish oil-omega-3 fatty acids 1000 MG capsule Take 1 g by mouth at bedtime.   ipratropium (ATROVENT HFA) 17 MCG/ACT inhaler Inhale 2 puffs into the lungs every 4 (four) hours as needed for  wheezing. Use for coughing   pantoprazole (PROTONIX) 40 MG tablet Take 40 mg by mouth 2 (two) times daily.   rosuvastatin (CRESTOR) 40 MG tablet Take 40 mg by mouth at bedtime.   albuterol (VENTOLIN HFA) 108 (90 Base) MCG/ACT inhaler Inhale 2 puffs into the lungs every 6 (six) hours as needed for wheezing or shortness of breath. (Patient not taking: Reported on 05/03/2022)   FLUAD QUADRIVALENT 0.5 ML injection  (Patient not taking: Reported on 01/03/2023)   [DISCONTINUED] buPROPion (WELLBUTRIN SR) 150 MG 12 hr tablet Take 150 mg by mouth every morning. (Patient not taking: Reported on 01/03/2023)   [DISCONTINUED] buPROPion (WELLBUTRIN) 75 MG tablet Take 75 mg by mouth 2 (two) times daily. (Patient not taking: Reported on 01/03/2023)   [DISCONTINUED] buPROPion (WELLBUTRIN) 75 MG tablet Take 75 mg by mouth in the morning. (Patient not taking: Reported on 01/03/2023)   No facility-administered encounter medications on file as of 01/03/2023.    Allergies as of 01/03/2023 - Review Complete 01/03/2023  Allergen Reaction Noted   Fluticasone-umeclidin-vilant  05/03/2022   Penicillins Other (See Comments) 12/01/2010    Past Medical History:  Diagnosis Date   Anxiety    CHF (congestive heart failure) (HCC)    EF 35%. WITH MEDICATION HIS EF IS NOW 55%   Coronary artery disease    ruptured plaque in the LAD - started Plavix at that point.  Diverticulosis    Enlarged prostate    Hyperlipidemia    PVC's (premature ventricular contractions)    Renal cyst     Past Surgical History:  Procedure Laterality Date   BIOPSY  07/22/2018   Procedure: BIOPSY;  Surgeon: Kathi Der, MD;  Location: [facility];  Service: Gastroenterology;;   CARDIAC CATHETERIZATION  12/04/2003   EF 30-35%   CARDIOVASCULAR STRESS TEST  08/20/2006   EF 50%   ESOPHAGOGASTRODUODENOSCOPY (EGD) WITH PROPOFOL Left 07/22/2018   Procedure: ESOPHAGOGASTRODUODENOSCOPY (EGD) WITH PROPOFOL;  Surgeon: Kathi Der, MD;  Location:  [facility];  Service: Gastroenterology;  Laterality: Left;   HYDROCELE EXCISION / REPAIR     NEPHRECTOMY     TONSILLECTOMY     US ECHOCARDIOGRAPHY  08/07/2005   EF 50-55%    Family History  Problem Relation Age of Onset   Heart disease Mother    Sarcoidosis Mother    Heart attack Father    Heart attack Brother    Coronary artery disease Brother     Social History   Socioeconomic History   Marital status: Married    Spouse name: Not on file   Number of children: Not on file   Years of education: Not on file   Highest education level: Not on file  Occupational History   Not on file  Tobacco Use   Smoking status: Former    Current packs/day: 0.00    Types: Cigarettes    Quit date: 05/08/1992    Years since quitting: 30.6   Smokeless tobacco: Never  Vaping Use   Vaping status: Never Used  Substance and Sexual Activity   Alcohol use: Yes    Comment: occ   Drug use: No   Sexual activity: Not on file  Other Topics Concern   Not on file  Social History Narrative   Not on file   Social Determinants of Health   Financial Resource Strain: Not on file  Food Insecurity: Not on file  Transportation Needs: Not on file  Physical Activity: Not on file  Stress: Not on file  Social Connections: Not on file  Intimate Partner Violence: Not on file    Review of Systems  Respiratory:  Positive for cough and shortness of breath.     Vitals:   01/03/23 0839  BP: 130/60  Pulse: (!) 57  Temp: (!) 97.2 F (36.2 C)  SpO2: 98%     Physical Exam Constitutional:      Appearance: Normal appearance.  HENT:     Head: Normocephalic.     Mouth/Throat:     Mouth: Mucous membranes are moist.  Eyes:     General: No scleral icterus. Cardiovascular:     Rate and Rhythm: Normal rate and regular rhythm.     Heart sounds: No murmur heard.    No friction rub.  Pulmonary:     Effort: No respiratory distress.     Breath sounds: No stridor. No wheezing, rhonchi or rales.   Musculoskeletal:     Cervical back: No rigidity or tenderness.  Neurological:     Mental Status: He is alert.  Psychiatric:        Mood and Affect: Mood normal.    Data Reviewed: Records from his primary doctor's office noted  PFT shows no obstruction, no significant bronchodilator response, no restriction, normal diffusing capacity  Assessment:  Chronic cough Intermittent shortness of breath -All appears to be better  He does not feel limited with activities  Past history of  smoking, quit over 20 years ago with no label of obstructive lung disease  Obstructive sleep apnea, appears stable  Plan/Recommendations: I will see him as needed  Encouraged to call with significant concerns  He has a history of obstructive sleep apnea -Manages his own CPAP  Encouraged to ensure he follows up with his primary doctor on a regular basis  Virl Diamond MD [facility] 01/03/2023, 8:43 AM  CC: Chilton Greathouse, MD

## 2023-01-03 NOTE — Patient Instructions (Addendum)
With you feeling very well, I do not believe there is much to change at present  Use albuterol as needed  Call us with significant concerns  I will see you as needed  Stay active, regular exercises  Continue using your CPAP nightly

## 2023-02-10 DIAGNOSIS — Z23 Encounter for immunization: Secondary | ICD-10-CM | POA: Diagnosis not present

## 2023-03-14 DIAGNOSIS — R0600 Dyspnea, unspecified: Secondary | ICD-10-CM | POA: Diagnosis not present

## 2023-03-14 DIAGNOSIS — I129 Hypertensive chronic kidney disease with stage 1 through stage 4 chronic kidney disease, or unspecified chronic kidney disease: Secondary | ICD-10-CM | POA: Diagnosis not present

## 2023-03-14 DIAGNOSIS — D692 Other nonthrombocytopenic purpura: Secondary | ICD-10-CM | POA: Diagnosis not present

## 2023-03-14 DIAGNOSIS — E785 Hyperlipidemia, unspecified: Secondary | ICD-10-CM | POA: Diagnosis not present

## 2023-03-14 DIAGNOSIS — N1831 Chronic kidney disease, stage 3a: Secondary | ICD-10-CM | POA: Diagnosis not present

## 2023-03-14 DIAGNOSIS — I4891 Unspecified atrial fibrillation: Secondary | ICD-10-CM | POA: Diagnosis not present

## 2023-03-14 DIAGNOSIS — F439 Reaction to severe stress, unspecified: Secondary | ICD-10-CM | POA: Diagnosis not present

## 2023-03-14 DIAGNOSIS — I2510 Atherosclerotic heart disease of native coronary artery without angina pectoris: Secondary | ICD-10-CM | POA: Diagnosis not present

## 2023-05-21 DIAGNOSIS — L578 Other skin changes due to chronic exposure to nonionizing radiation: Secondary | ICD-10-CM | POA: Diagnosis not present

## 2023-05-21 DIAGNOSIS — L570 Actinic keratosis: Secondary | ICD-10-CM | POA: Diagnosis not present

## 2023-05-28 DIAGNOSIS — N403 Nodular prostate with lower urinary tract symptoms: Secondary | ICD-10-CM | POA: Diagnosis not present

## 2023-05-31 DIAGNOSIS — I129 Hypertensive chronic kidney disease with stage 1 through stage 4 chronic kidney disease, or unspecified chronic kidney disease: Secondary | ICD-10-CM | POA: Diagnosis not present

## 2023-05-31 DIAGNOSIS — R809 Proteinuria, unspecified: Secondary | ICD-10-CM | POA: Diagnosis not present

## 2023-05-31 DIAGNOSIS — Z905 Acquired absence of kidney: Secondary | ICD-10-CM | POA: Diagnosis not present

## 2023-05-31 DIAGNOSIS — N183 Chronic kidney disease, stage 3 unspecified: Secondary | ICD-10-CM | POA: Diagnosis not present

## 2023-05-31 DIAGNOSIS — N2581 Secondary hyperparathyroidism of renal origin: Secondary | ICD-10-CM | POA: Diagnosis not present

## 2023-06-04 DIAGNOSIS — Z905 Acquired absence of kidney: Secondary | ICD-10-CM | POA: Diagnosis not present

## 2023-06-04 DIAGNOSIS — N403 Nodular prostate with lower urinary tract symptoms: Secondary | ICD-10-CM | POA: Diagnosis not present

## 2023-06-04 DIAGNOSIS — R350 Frequency of micturition: Secondary | ICD-10-CM | POA: Diagnosis not present

## 2023-06-21 DIAGNOSIS — L578 Other skin changes due to chronic exposure to nonionizing radiation: Secondary | ICD-10-CM | POA: Diagnosis not present

## 2023-09-17 DIAGNOSIS — I2510 Atherosclerotic heart disease of native coronary artery without angina pectoris: Secondary | ICD-10-CM | POA: Diagnosis not present

## 2023-09-17 DIAGNOSIS — M109 Gout, unspecified: Secondary | ICD-10-CM | POA: Diagnosis not present

## 2023-09-17 DIAGNOSIS — I509 Heart failure, unspecified: Secondary | ICD-10-CM | POA: Diagnosis not present

## 2023-09-17 DIAGNOSIS — N1831 Chronic kidney disease, stage 3a: Secondary | ICD-10-CM | POA: Diagnosis not present

## 2023-09-17 DIAGNOSIS — I130 Hypertensive heart and chronic kidney disease with heart failure and stage 1 through stage 4 chronic kidney disease, or unspecified chronic kidney disease: Secondary | ICD-10-CM | POA: Diagnosis not present

## 2023-09-17 DIAGNOSIS — E785 Hyperlipidemia, unspecified: Secondary | ICD-10-CM | POA: Diagnosis not present

## 2023-09-17 DIAGNOSIS — N401 Enlarged prostate with lower urinary tract symptoms: Secondary | ICD-10-CM | POA: Diagnosis not present

## 2023-09-24 DIAGNOSIS — E785 Hyperlipidemia, unspecified: Secondary | ICD-10-CM | POA: Diagnosis not present

## 2023-09-24 DIAGNOSIS — Z1331 Encounter for screening for depression: Secondary | ICD-10-CM | POA: Diagnosis not present

## 2023-09-24 DIAGNOSIS — I2510 Atherosclerotic heart disease of native coronary artery without angina pectoris: Secondary | ICD-10-CM | POA: Diagnosis not present

## 2023-09-24 DIAGNOSIS — N1831 Chronic kidney disease, stage 3a: Secondary | ICD-10-CM | POA: Diagnosis not present

## 2023-09-24 DIAGNOSIS — Z1339 Encounter for screening examination for other mental health and behavioral disorders: Secondary | ICD-10-CM | POA: Diagnosis not present

## 2023-09-24 DIAGNOSIS — I129 Hypertensive chronic kidney disease with stage 1 through stage 4 chronic kidney disease, or unspecified chronic kidney disease: Secondary | ICD-10-CM | POA: Diagnosis not present

## 2023-09-24 DIAGNOSIS — N401 Enlarged prostate with lower urinary tract symptoms: Secondary | ICD-10-CM | POA: Diagnosis not present

## 2023-09-24 DIAGNOSIS — D692 Other nonthrombocytopenic purpura: Secondary | ICD-10-CM | POA: Diagnosis not present

## 2023-09-24 DIAGNOSIS — Z0000 Encounter for general adult medical examination without abnormal findings: Secondary | ICD-10-CM | POA: Diagnosis not present

## 2023-09-24 DIAGNOSIS — I4891 Unspecified atrial fibrillation: Secondary | ICD-10-CM | POA: Diagnosis not present

## 2023-09-24 DIAGNOSIS — R82998 Other abnormal findings in urine: Secondary | ICD-10-CM | POA: Diagnosis not present

## 2023-09-24 DIAGNOSIS — F439 Reaction to severe stress, unspecified: Secondary | ICD-10-CM | POA: Diagnosis not present

## 2023-10-22 DIAGNOSIS — L570 Actinic keratosis: Secondary | ICD-10-CM | POA: Diagnosis not present

## 2023-10-22 DIAGNOSIS — L738 Other specified follicular disorders: Secondary | ICD-10-CM | POA: Diagnosis not present

## 2023-10-22 DIAGNOSIS — L448 Other specified papulosquamous disorders: Secondary | ICD-10-CM | POA: Diagnosis not present

## 2023-11-19 ENCOUNTER — Ambulatory Visit: Attending: Physician Assistant | Admitting: Physician Assistant

## 2023-11-19 ENCOUNTER — Other Ambulatory Visit: Payer: Self-pay

## 2023-11-19 ENCOUNTER — Encounter: Payer: Self-pay | Admitting: Physician Assistant

## 2023-11-19 VITALS — BP 140/80 | HR 58 | Ht 68.0 in | Wt 187.0 lb

## 2023-11-19 DIAGNOSIS — E782 Mixed hyperlipidemia: Secondary | ICD-10-CM | POA: Diagnosis not present

## 2023-11-19 DIAGNOSIS — I480 Paroxysmal atrial fibrillation: Secondary | ICD-10-CM | POA: Diagnosis not present

## 2023-11-19 DIAGNOSIS — R079 Chest pain, unspecified: Secondary | ICD-10-CM | POA: Diagnosis not present

## 2023-11-19 DIAGNOSIS — I5022 Chronic systolic (congestive) heart failure: Secondary | ICD-10-CM

## 2023-11-19 DIAGNOSIS — I2510 Atherosclerotic heart disease of native coronary artery without angina pectoris: Secondary | ICD-10-CM | POA: Diagnosis not present

## 2023-11-19 MED ORDER — ROSUVASTATIN CALCIUM 40 MG PO TABS: 40.0000 mg | ORAL_TABLET | Freq: Every day | ORAL | 2 refills | Status: AC

## 2023-11-19 MED ORDER — AMLODIPINE BESYLATE 5 MG PO TABS: 5.0000 mg | ORAL_TABLET | Freq: Every day | ORAL | 3 refills | Status: AC

## 2023-11-19 MED ORDER — APIXABAN 2.5 MG PO TABS: 2.5000 mg | ORAL_TABLET | Freq: Two times a day (BID) | ORAL | 1 refills | Status: AC

## 2023-11-19 MED ORDER — CARVEDILOL 25 MG PO TABS: 25.0000 mg | ORAL_TABLET | Freq: Two times a day (BID) | ORAL | 3 refills | Status: AC

## 2023-11-19 NOTE — Telephone Encounter (Signed)
 Prescription refill request for Eliquis  received. Indication: Afib  Last office visit:11/19/23 [place])  Scr: 2.06 (05/31/23 via [facility])  Age: 78 Weight: 84.8kg  Per Orren Fabry, PA dose can be decreased to 2.5mg  BID due to age and Scr. I called pt and discussed dose change. Pt verbalized understanding.

## 2023-11-19 NOTE — Patient Instructions (Signed)
 Medication Instructions:   Your physician recommends that you continue on your current medications as directed. Please refer to the Current Medication list given to you today.  *If you need a refill on your cardiac medications before your next appointment, please call your pharmacy*    Follow-Up: At [facility], you and your health needs are our priority.  As part of our continuing mission to provide you with exceptional heart care, our providers are all part of one team.  This team includes your primary Cardiologist (physician) and Advanced Practice Providers or APPs (Physician Assistants and Nurse Practitioners) who all work together to provide you with the care you need, when you need it.  Your next appointment:   1 year(s)  Provider:   Dr. Michele

## 2023-11-19 NOTE — Progress Notes (Addendum)
 Cardiology Office Note   Date:  11/19/2023  ID:  Maurice Barron, DOB 05/20/1945, MRN [id number] PCP: Janey Santos, MD  [facility] Providers Cardiologist:  Aleene Passe, MD   History of Present Illness Maurice Barron is a 78 y.o. male with a past medical history of CAD status post ruptured plaque in his left proximal anterior descending artery seen during cardiac catheterization in 2005, moderate irregularities in the other coronary arteries (started on Plavix at that time), chronic systolic congestive heart failure with normalized LV function, hyperlipidemia, atrial fibrillation diagnosed March 2020 here for follow-up appointment.  Has been followed for the last 10 years by Dr. Passe.  Unfortunately, has struggled with bleeding and bruising on Plavix.  [facility]  Was admitted March 2020 with weakness multiple falls, near syncope.  Was hypotensive and had AKI secondary to reduced p.o. intake.  Also had endoscopy and following endoscopy he was noted to be in atrial fibrillation with rapid ventricular response.  Completely asymptomatic at this time.  Was given a dose of esmolol  and started on IV Cardizem .  Converted to normal sinus rhythm shortly thereafter.  Repeat echo March 2020 revealed normal left ventricular systolic function.  He did experience some pressure in his upper abdomen and lower chest.  Was worse with sitting for prolonged periods of time.  Not related exercise.  Was back to the [facility] at that time and pain was not related to exertion.  CT of the lower abdomen and chest have been unremarkable.  Upper and lower endoscopy without any specific diagnosis.  Did develop transient atrial fibrillation during upper endoscopy and started on Eliquis .  Did 14-day outpatient telemetry monitor revealing normal sinus rhythm with episodes sinus tachycardia and sinus bradycardia.  Single nonsignificant 7 beat run of nonsustained VT.  No significant  arrhythmias to explain any syncope or abdominal/chest pain.  Echo July 22, 2018 showed normal LVEF of 66 5%.  He was seen in January 2022 and was not exercising much since he went back to work.  No chest pain or dyspnea with his usual activities.  Last seen October 18, 2021 and was working with [employer].  No chest pain.  Tried Trelegy but did not think his breathing was any better on it.  Exercises 3 days a week.  Today, he presents with atrial fibrillation and coronary artery disease for cardiovascular follow-up.  His last cardiac evaluation in 2020 showed a left ventricular ejection fraction of 60 to 65 percent. He exercises three days a week without experiencing dyspnea. He has a history of lower chest and upper abdominal pain, which has significantly decreased and is now almost non-existent, with no pain during exercise. There is no peripheral edema.  He has atrial fibrillation and is currently on Eliquis . He is concerned about excessive bleeding from minor cuts. His past medical history includes a ruptured plaque in LAD in 2005, managed without stenting. He has no history of diabetes or stroke. Cholesterol levels are regularly monitored by his primary care physician.  Reports no shortness of breath nor dyspnea on exertion. Reports no chest pain, pressure, or tightness. No edema, orthopnea, PND. Reports no palpitations.   Discussed the use of AI scribe software for clinical note transcription with the patient, who gave verbal consent to proceed.   ROS: pertinent ROS in HPI  Studies Reviewed EKG Interpretation Date/Time:  Monday November 19 2023 15:23:48 EDT Ventricular Rate:  58 PR Interval:  166 QRS  Duration:  96 QT Interval:  418 QTC Calculation: 410 R Axis:   -21  Text Interpretation: Sinus bradycardia When compared with ECG of 22-Jul-2018 12:01, Nonspecific T wave abnormality has replaced inverted T waves in Inferior leads T wave inversion no longer evident in Anterior leads  Confirmed by Lucien Blanc [phone]) on 11/19/2023 4:37:58 PM    Studies from 2020 noted in HPI   Risk Assessment/Calculations  CHA2DS2-VASc Score = 3   This indicates a 3.2% annual risk of stroke. The patient's score is based upon: CHF History: 0 HTN History: 1 Diabetes History: 0 Stroke History: 0 Vascular Disease History: 0 Age Score: 2 Gender Score: 0      Physical Exam VS:  BP (!) 140/80   Pulse (!) 58   Ht 5' 8 (1.727 m)   Wt 187 lb (84.8 kg)   SpO2 96%   BMI 28.43 kg/m        Wt Readings from Last 3 Encounters:  11/19/23 187 lb (84.8 kg)  01/03/23 184 lb 6.4 oz (83.6 kg)  07/08/22 186 lb (84.4 kg)    GEN: Well nourished, well developed in no acute distress NECK: No JVD; No carotid bruits CARDIAC: RRR, no murmurs, rubs, gallops RESPIRATORY:  Clear to auscultation without rales, wheezing or rhonchi  ABDOMEN: Soft, non-tender, non-distended EXTREMITIES:  No edema; No deformity   ASSESSMENT AND PLAN  Coronary artery disease Coronary artery disease with history of ruptured LAD in 2005. Normal cardiac function, ejection fraction 60-65%, asymptomatic.   Atrial fibrillation Atrial fibrillation with normal cardiac function. On Eliquis  for stroke prevention, stroke risk ~3% per year. Discussed Eliquis  discontinuation due to bleeding risk concerns. - Refill Eliquis   - Coordinate management with Dr. Michele  Chronic systolic congestive heart failure -normal LVEF on echo back in 2020 -no recent issues with LE edema -euvolemic on exam  Hyperlipidemia -monitored per PCP     Dispo: He can follow-up in a year with me or MD  Signed, Blanc LOISE Lucien, PA-C

## 2023-11-22 DIAGNOSIS — N2581 Secondary hyperparathyroidism of renal origin: Secondary | ICD-10-CM | POA: Diagnosis not present

## 2023-11-22 DIAGNOSIS — R809 Proteinuria, unspecified: Secondary | ICD-10-CM | POA: Diagnosis not present

## 2023-11-22 DIAGNOSIS — I129 Hypertensive chronic kidney disease with stage 1 through stage 4 chronic kidney disease, or unspecified chronic kidney disease: Secondary | ICD-10-CM | POA: Diagnosis not present

## 2023-11-22 DIAGNOSIS — N183 Chronic kidney disease, stage 3 unspecified: Secondary | ICD-10-CM | POA: Diagnosis not present

## 2023-11-22 DIAGNOSIS — Z905 Acquired absence of kidney: Secondary | ICD-10-CM | POA: Diagnosis not present

## 2023-12-05 ENCOUNTER — Ambulatory Visit: Admitting: Physician Assistant

## 2024-01-23 ENCOUNTER — Telehealth: Payer: Self-pay

## 2024-01-23 NOTE — Telephone Encounter (Signed)
   Pre-operative Risk Assessment    Patient Name: Maurice Barron  DOB: 1945/12/13 MRN: [id number] Date of last office visit: 11/19/23  Orren Fabry Date of next office visit: none   Request for Surgical Clearance    Procedure:  colonoscopy  Date of Surgery:  Clearance 02/13/24                                Surgeon:  Dr. Elsie Cree Surgeon's Group or Practice Name:  [facility] Phone number:  [phone] Fax number:  [phone] Type of Clearance Requested:   - Medical  - Pharmacy:  Hold Apixaban  (Eliquis )   Type of Anesthesia:  propofol    Additional requests/questions:    Bonney Arlyne LITTIE Kallie   01/23/2024, 4:15 PM

## 2024-01-25 NOTE — Telephone Encounter (Signed)
   Patient Name: Maurice Barron  DOB: 10-Jan-1946 MRN: [id number]  Primary Cardiologist: Aleene Passe, MD (Inactive)  Chart reviewed as part of pre-operative protocol coverage. Pre-op clearance already addressed by colleagues in earlier phone notes. To summarize recommendations:  -Per office protocol, patient can hold Eliquis  for 2 days prior to procedure.  He can resume a medic safe to do so. Patient will not need bridging with Lovenox (enoxaparin) around procedure.  In regards to medical clearance, patient was seen by myself recently in the clinic.  No chest pain or shortness of breath at that time.  As long as patient remains asymptomatic from a cardiac standpoint there is no further cardiac testing needed prior to colonoscopy.  Will route this bundled recommendation to requesting provider via Epic fax function and remove from pre-op pool. Please call with questions.  [name], PA-C 01/25/2024, 9:41 AM

## 2024-01-25 NOTE — Telephone Encounter (Signed)
 Patient with diagnosis of atrial fibrillation on Eliquis  for anticoagulation.    Procedure:  colonoscopy   Date of Surgery:  Clearance 02/13/24  CHA2DS2-VASc Score = 4   This indicates a 4.8% annual risk of stroke. The patient's score is based upon: CHF History: 1 HTN History: 0 Diabetes History: 0 Stroke History: 0 Vascular Disease History: 1 Age Score: 2 Gender Score: 0    CrCl 35 ml/min Platelet count 201 K (07/08/2022)   Patient has not had an Afib/aflutter ablation within the last 3 months or DCCV within the last 30 days  Per office protocol, patient can hold Eliquis  for 2 days prior to procedure.   Patient will not need bridging with Lovenox (enoxaparin) around procedure.  **This guidance is not considered finalized until pre-operative APP has relayed final recommendations.**

## 2024-02-13 DIAGNOSIS — Z860101 Personal history of adenomatous and serrated colon polyps: Secondary | ICD-10-CM | POA: Diagnosis not present

## 2024-02-13 DIAGNOSIS — K648 Other hemorrhoids: Secondary | ICD-10-CM | POA: Diagnosis not present

## 2024-02-13 DIAGNOSIS — Z09 Encounter for follow-up examination after completed treatment for conditions other than malignant neoplasm: Secondary | ICD-10-CM | POA: Diagnosis not present

## 2024-02-13 DIAGNOSIS — K5730 Diverticulosis of large intestine without perforation or abscess without bleeding: Secondary | ICD-10-CM | POA: Diagnosis not present

## 2024-03-10 DIAGNOSIS — R0600 Dyspnea, unspecified: Secondary | ICD-10-CM | POA: Diagnosis not present

## 2024-03-10 DIAGNOSIS — I129 Hypertensive chronic kidney disease with stage 1 through stage 4 chronic kidney disease, or unspecified chronic kidney disease: Secondary | ICD-10-CM | POA: Diagnosis not present

## 2024-03-10 DIAGNOSIS — I2510 Atherosclerotic heart disease of native coronary artery without angina pectoris: Secondary | ICD-10-CM | POA: Diagnosis not present

## 2024-03-10 DIAGNOSIS — N401 Enlarged prostate with lower urinary tract symptoms: Secondary | ICD-10-CM | POA: Diagnosis not present

## 2024-03-10 DIAGNOSIS — I4891 Unspecified atrial fibrillation: Secondary | ICD-10-CM | POA: Diagnosis not present

## 2024-03-10 DIAGNOSIS — N1831 Chronic kidney disease, stage 3a: Secondary | ICD-10-CM | POA: Diagnosis not present

## 2024-03-10 DIAGNOSIS — E785 Hyperlipidemia, unspecified: Secondary | ICD-10-CM | POA: Diagnosis not present

## 2024-03-10 DIAGNOSIS — F439 Reaction to severe stress, unspecified: Secondary | ICD-10-CM | POA: Diagnosis not present

## 2024-04-14 DIAGNOSIS — H524 Presbyopia: Secondary | ICD-10-CM | POA: Diagnosis not present
